# Patient Record
Sex: Female | Born: 1975 | Race: Black or African American | Hispanic: No | Marital: Single | Smoking: Never smoker
Health system: Southern US, Community
[De-identification: ages and names within clinical notes are randomized; demographics above are authoritative.]

## PROBLEM LIST (undated history)

## (undated) DIAGNOSIS — G43909 Migraine, unspecified, not intractable, without status migrainosus: Secondary | ICD-10-CM

## (undated) DIAGNOSIS — Z8742 Personal history of other diseases of the female genital tract: Secondary | ICD-10-CM

## (undated) DIAGNOSIS — O039 Complete or unspecified spontaneous abortion without complication: Secondary | ICD-10-CM

## (undated) DIAGNOSIS — R011 Cardiac murmur, unspecified: Secondary | ICD-10-CM

## (undated) DIAGNOSIS — I10 Essential (primary) hypertension: Secondary | ICD-10-CM

## (undated) HISTORY — PX: BREAST CYST ASPIRATION: SHX578

## (undated) HISTORY — PX: OTHER SURGICAL HISTORY: SHX169

## (undated) HISTORY — DX: Cardiac murmur, unspecified: R01.1

## (undated) HISTORY — DX: Complete or unspecified spontaneous abortion without complication: O03.9

## (undated) HISTORY — DX: Essential (primary) hypertension: I10

## (undated) HISTORY — DX: Migraine, unspecified, not intractable, without status migrainosus: G43.909

## (undated) HISTORY — DX: Personal history of other diseases of the female genital tract: Z87.42

---
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
## 2006-09-04 HISTORY — PX: BREAST LUMPECTOMY: SHX2

## 2008-09-04 DIAGNOSIS — O039 Complete or unspecified spontaneous abortion without complication: Secondary | ICD-10-CM

## 2008-09-04 HISTORY — DX: Complete or unspecified spontaneous abortion without complication: O03.9

## 2008-09-04 HISTORY — PX: OTHER SURGICAL HISTORY: SHX169

## 2017-08-02 ENCOUNTER — Ambulatory Visit (INDEPENDENT_AMBULATORY_CARE_PROVIDER_SITE_OTHER): Payer: BLUE CROSS/BLUE SHIELD | Admitting: Family Medicine

## 2017-08-02 ENCOUNTER — Encounter: Payer: Self-pay | Admitting: Family Medicine

## 2017-08-02 ENCOUNTER — Encounter: Payer: Self-pay | Admitting: *Deleted

## 2017-08-02 VITALS — BP 138/102 | HR 81 | Temp 97.7°F | Ht 66.0 in | Wt 232.8 lb

## 2017-08-02 DIAGNOSIS — R1907 Generalized intra-abdominal and pelvic swelling, mass and lump: Secondary | ICD-10-CM

## 2017-08-02 DIAGNOSIS — R739 Hyperglycemia, unspecified: Secondary | ICD-10-CM

## 2017-08-02 DIAGNOSIS — E7849 Other hyperlipidemia: Secondary | ICD-10-CM

## 2017-08-02 DIAGNOSIS — R112 Nausea with vomiting, unspecified: Secondary | ICD-10-CM | POA: Diagnosis not present

## 2017-08-02 DIAGNOSIS — R1011 Right upper quadrant pain: Secondary | ICD-10-CM

## 2017-08-02 DIAGNOSIS — I10 Essential (primary) hypertension: Secondary | ICD-10-CM

## 2017-08-02 LAB — POCT URINE PREGNANCY: PREG TEST UR: NEGATIVE

## 2017-08-02 MED ORDER — NIFEDIPINE ER 30 MG PO TB24: 30.0000 mg | ORAL_TABLET | Freq: Every day | ORAL | 3 refills | Status: DC

## 2017-08-02 NOTE — Patient Instructions (Addendum)
BEFORE YOU LEAVE: -labs -vaccines if she wishes -follow up: 1 month (30 minutes)  Start the nifedepine and take every day for the blood pressure.  -We placed a referral for you as discussed for a CT scan of the abdomen. Call if this is not scheduled in the next 1 week.  We have ordered labs or studies at this visit. It can take up to 1-2 weeks for results and processing. IF results require follow up or explanation, we will call you with instructions. Clinically stable results will be released to your [facility]. If you have not heard from [place] or cannot find your results in [facility] in 2 weeks please contact our office at [phone].  If you are not yet signed up for [facility], please consider signing up.   We recommend the following healthy lifestyle for LIFE: 1) Small portions. But, make sure to get regular (at least 3 per day), healthy meals and small healthy snacks if needed.  2) Eat a healthy clean diet.   TRY TO EAT: -at least 5-7 servings of low sugar, colorful, and nutrient rich vegetables per day (not corn, potatoes or bananas.) -berries are the best choice if you wish to eat fruit (only eat small amounts if trying to reduce weight)  -lean meets (fish, white meat of chicken or [place]) -vegan proteins for some meals - beans or tofu, whole grains, nuts and seeds -Replace bad fats with good fats - good fats include: fish, nuts and seeds, canola oil, olive oil -small amounts of low fat or non fat dairy -small amounts of100 % whole grains - check the lables -drink plenty of water  AVOID: -SUGAR, sweets, anything with added sugar, corn syrup or sweeteners - must read labels as even foods advertised as "healthy" often are loaded with sugar -if you must have a sweetener, small amounts of stevia may be best -sweetened beverages and artificially sweetened beverages -simple starches (rice, bread, potatoes, pasta, chips, etc - small amounts of 100% whole grains are ok) -red meat, pork,  butter -fried foods, fast food, processed food, excessive dairy, eggs and coconut.  3)Get at least 150 minutes of sweaty aerobic exercise per week.  4)Reduce stress - consider counseling, meditation and relaxation to balance other aspects of your life.

## 2017-08-02 NOTE — Progress Notes (Signed)
HPI:  Crystal Nash is here to establish care.  Last PCP and physical:  Has the following chronic problems that require follow up and concerns today:  Hypertension: -On medications in the past, for medications at [facility] -Reports lost her health insurance and has not been on medications in several years -Went to urgent care for elevated blood pressure recently and they gave her lisinopril, but she has not started -Denies chest pain, shortness of breath, headache, vision changes currently  -She would like to conceive  History of migraines: -These are rare, maybe 1 migraine every 3 months -They respond to Excedrin -No change in frequency or character recently  Reported history of hyperglycemia and hyperlipidemia: -Not on any medications for this  Morbid obesity: -Reports fianc is a wonderful cook and she has been eating too much -No regular exercise  Right upper quadrant pain: -For several years -Intermittent, severe and crampy, usually occurs after meals -Thinks had a workup somewhere in the past, cannot remember where or what they did, reports they did refer her to a specialist -He sometimes has vomiting with these episodes -The episodes occur about once a month -He recently has noticed a mass in her right upper quadrant  ROS negative for unless reported above: fevers, unintentional weight loss, hearing or vision loss, chest pain, palpitations, struggling to breath, hemoptysis, melena, hematochezia, hematuria, falls, loc, si, thoughts of self harm  Past Medical History:  Diagnosis Date  . Heart murmur   . History of abnormal cervical Pap smear 1993  . Hypertension   . Migraines   . Miscarriage 2010      Family History  Problem Relation Age of Onset  . Hypertension Mother   . Hyperlipidemia Mother   . Hypertension Father   . Hyperlipidemia Father   . Heart disease Father     Social History   Socioeconomic History  . Marital status: Single    Spouse  name: None  . Number of children: None  . Years of education: None  . Highest education level: None  Social Needs  . Financial resource strain: None  . Food insecurity - worry: None  . Food insecurity - inability: None  . Transportation needs - medical: None  . Transportation needs - non-medical: None  Occupational History  . None  Tobacco Use  . Smoking status: Never Smoker  . Smokeless tobacco: Never Used  Substance and Sexual Activity  . Alcohol use: None  . Drug use: None  . Sexual activity: None  Other Topics Concern  . None  Social History Narrative  . None     Current Outpatient Medications:  .  lisinopril-hydrochlorothiazide (PRINZIDE,ZESTORETIC) 10-12.5 MG tablet, Take 1 tablet by mouth daily., Disp: , Rfl:  .  NIFEdipine (PROCARDIA-XL/ADALAT CC) 30 MG 24 hr tablet, Take 1 tablet (30 mg total) by mouth daily., Disp: 30 tablet, Rfl: 3  EXAM:  Vitals:   08/02/17 1450  BP: (!) 138/102  Pulse: 81  Temp: 97.7 F (36.5 C)    Body mass index is 37.57 kg/m.  GENERAL: vitals reviewed and listed above, alert, oriented, appears well hydrated and in no acute distress  HEENT: atraumatic, conjunttiva clear, no obvious abnormalities on inspection of external nose and ears  NECK: no obvious masses on inspection  LUNGS: clear to auscultation bilaterally, no wheezes, rales or rhonchi, good air movement  CV: HRRR, no peripheral edema  Abdomen: Bowel sounds positive all 4 quadrants, small firm mass about the size of a golf ball in  the right upper quadrant in the area of the gallbladder, Some tenderness to palpation here, otherwise no tenderness to palpation, rebound or guarding  MS: moves all extremities without noticeable abnormality  PSYCH: pleasant and cooperative, no obvious depression or anxiety  ASSESSMENT AND PLAN:  Discussed the following assessment and plan: More than 50% of over 45 minutes spent in total in caring for this patient was spent face-to-face  with the patient, counseling and/or coordinating care.    Essential hypertension - Plan: Comprehensive metabolic panel, CBC -Discussed options for treatment, she wants something safe in pregnancy, but she thinks calcium channel blockers worked well for her -We will start nifedipine -Advised her to get plugged in with an obstetrician given her age and desire to conceive along with other health issues -Follow-up in 1 month  Morbid obesity ([place]) - Plan: Hemoglobin A1c, HDL cholesterol, Cholesterol, total Other hyperlipidemia Hyperglycemia -Lifestyle recommendations -Labs, further treatment pending results  RUQ pain Generalized intra-abdominal and pelvic swelling, mass and lump - Plan: CT Abdomen Pelvis W Contrast Nausea and vomiting, intractability of vomiting not specified, unspecified vomiting type - Plan: CT Abdomen Pelvis W Contrast, POCT urine pregnancy -Urine pregnancy negative -CT abdomen, labs  -We reviewed the PMH, PSH, FH, SH, Meds and Allergies. -We provided refills for any medications we will prescribe as needed. -We addressed current concerns per orders and patient instructions. -We have asked for records for pertinent exams, studies, vaccines and notes from previous providers. -We have advised patient to follow up per instructions below.   -Patient advised to return or notify a doctor immediately if symptoms worsen or persist or new concerns arise.  Patient Instructions  BEFORE YOU LEAVE: -labs -vaccines if she wishes -follow up: 1 month (30 minutes)  Start the nifedepine and take every day for the blood pressure.  -We placed a referral for you as discussed for a CT scan of the abdomen. Call if this is not scheduled in the next 1 week.  We have ordered labs or studies at this visit. It can take up to 1-2 weeks for results and processing. IF results require follow up or explanation, we will call you with instructions. Clinically stable results will be released to  your [facility]. If you have not heard from [place] or cannot find your results in [facility] in 2 weeks please contact our office at [phone].  If you are not yet signed up for [facility], please consider signing up.   We recommend the following healthy lifestyle for LIFE: 1) Small portions. But, make sure to get regular (at least 3 per day), healthy meals and small healthy snacks if needed.  2) Eat a healthy clean diet.   TRY TO EAT: -at least 5-7 servings of low sugar, colorful, and nutrient rich vegetables per day (not corn, potatoes or bananas.) -berries are the best choice if you wish to eat fruit (only eat small amounts if trying to reduce weight)  -lean meets (fish, white meat of chicken or [place]) -vegan proteins for some meals - beans or tofu, whole grains, nuts and seeds -Replace bad fats with good fats - good fats include: fish, nuts and seeds, canola oil, olive oil -small amounts of low fat or non fat dairy -small amounts of100 % whole grains - check the lables -drink plenty of water  AVOID: -SUGAR, sweets, anything with added sugar, corn syrup or sweeteners - must read labels as even foods advertised as "healthy" often are loaded with sugar -if you must have a sweetener,  small amounts of stevia may be best -sweetened beverages and artificially sweetened beverages -simple starches (rice, bread, potatoes, pasta, chips, etc - small amounts of 100% whole grains are ok) -red meat, pork, butter -fried foods, fast food, processed food, excessive dairy, eggs and coconut.  3)Get at least 150 minutes of sweaty aerobic exercise per week.  4)Reduce stress - consider counseling, meditation and relaxation to balance other aspects of your life.            Colin Benton R.

## 2017-08-03 LAB — CBC
HCT: 37.2 % (ref 36.0–46.0)
Hemoglobin: 11.8 g/dL — ABNORMAL LOW (ref 12.0–15.0)
MCHC: 31.7 g/dL (ref 30.0–36.0)
MCV: 84.4 fl (ref 78.0–100.0)
PLATELETS: 443 10*3/uL — AB (ref 150.0–400.0)
RBC: 4.41 Mil/uL (ref 3.87–5.11)
RDW: 16.3 % — AB (ref 11.5–15.5)
WBC: 8.9 10*3/uL (ref 4.0–10.5)

## 2017-08-03 LAB — COMPREHENSIVE METABOLIC PANEL
ALT: 28 U/L (ref 0–35)
AST: 24 U/L (ref 0–37)
Albumin: 4.3 g/dL (ref 3.5–5.2)
Alkaline Phosphatase: 105 U/L (ref 39–117)
BILIRUBIN TOTAL: 0.4 mg/dL (ref 0.2–1.2)
BUN: 13 mg/dL (ref 6–23)
CO2: 29 meq/L (ref 19–32)
CREATININE: 0.95 mg/dL (ref 0.40–1.20)
Calcium: 9.9 mg/dL (ref 8.4–10.5)
Chloride: 100 mEq/L (ref 96–112)
GFR: 82.75 mL/min (ref >60.00)
GLUCOSE: 94 mg/dL (ref 70–99)
Potassium: 3.5 mEq/L (ref 3.5–5.1)
Sodium: 138 mEq/L (ref 135–145)
TOTAL PROTEIN: 8.5 g/dL — AB (ref 6.0–8.3)

## 2017-08-03 LAB — HEMOGLOBIN A1C: HEMOGLOBIN A1C: 6.2 % (ref 4.6–6.5)

## 2017-08-03 LAB — HDL CHOLESTEROL: HDL: 50.1 mg/dL (ref >39.00)

## 2017-08-03 LAB — CHOLESTEROL, TOTAL: Cholesterol: 213 mg/dL — ABNORMAL HIGH (ref 0–200)

## 2017-08-13 ENCOUNTER — Inpatient Hospital Stay: Admission: RE | Admit: 2017-08-13 | Payer: BLUE CROSS/BLUE SHIELD | Source: Ambulatory Visit

## 2017-08-14 ENCOUNTER — Inpatient Hospital Stay: Admission: RE | Admit: 2017-08-14 | Payer: BLUE CROSS/BLUE SHIELD | Source: Ambulatory Visit

## 2017-08-16 ENCOUNTER — Encounter: Payer: Self-pay | Admitting: *Deleted

## 2017-08-16 ENCOUNTER — Ambulatory Visit (INDEPENDENT_AMBULATORY_CARE_PROVIDER_SITE_OTHER)
Admission: RE | Admit: 2017-08-16 | Discharge: 2017-08-16 | Disposition: A | Discharge status: Home / Self Care | Payer: BLUE CROSS/BLUE SHIELD | Source: Ambulatory Visit | Attending: Family Medicine | Admitting: Family Medicine

## 2017-08-16 DIAGNOSIS — R1907 Generalized intra-abdominal and pelvic swelling, mass and lump: Secondary | ICD-10-CM | POA: Diagnosis not present

## 2017-08-16 DIAGNOSIS — R112 Nausea with vomiting, unspecified: Secondary | ICD-10-CM

## 2017-08-16 MED ORDER — IOPAMIDOL (ISOVUE-300) INJECTION 61%
100.0000 mL | Freq: Once | INTRAVENOUS | Status: AC | PRN
  Administered 2017-08-16: 100 mL via INTRAVENOUS

## 2017-09-09 NOTE — Progress Notes (Deleted)
HPI:  Follow up:  HTN: -poor compliance with care due to insurance issues prior to establishing here 07/2017 -she wants to conceive so started nifedepine last visit and advised preconception visit with obstetrician given comorbidities and age -reports: -denies:  Obesity, Prediabetes, Hyperlipidemia, elevated platelets. -advised stool cards and period history, but my staff was not be able to reach her by phone about her labs, they did send a letter per review notes -advised lifestyle recs last visit  RUQ pain: -chronic, many years -intermittent, crampy, after meals, sometimes emesis -she also thought there was a mass -CMP was ok, CT with "minimal ventral hernis" per report, but otherwise normal -pt reports: -denies:  ROS: See pertinent positives and negatives per HPI.  Past Medical History:  Diagnosis Date  . Heart murmur   . History of abnormal cervical Pap smear 1993  . Hypertension   . Migraines   . Miscarriage 2010    *** The histories are not reviewed yet. Please review them in the "History" navigator section and refresh this [place].  Family History  Problem Relation Age of Onset  . Hypertension Mother   . Hyperlipidemia Mother   . Hypertension Father   . Hyperlipidemia Father   . Heart disease Father     Social History   Socioeconomic History  . Marital status: Single    Spouse name: Not on file  . Number of children: Not on file  . Years of education: Not on file  . Highest education level: Not on file  Social Needs  . Financial resource strain: Not on file  . Food insecurity - worry: Not on file  . Food insecurity - inability: Not on file  . Transportation needs - medical: Not on file  . Transportation needs - non-medical: Not on file  Occupational History  . Not on file  Tobacco Use  . Smoking status: Never Smoker  . Smokeless tobacco: Never Used  Substance and Sexual Activity  . Alcohol use: Not on file  . Drug use: Not on file  . Sexual  activity: Not on file  Other Topics Concern  . Not on file  Social History Narrative  . Not on file     Current Outpatient Medications:  .  lisinopril-hydrochlorothiazide (PRINZIDE,ZESTORETIC) 10-12.5 MG tablet, Take 1 tablet by mouth daily., Disp: , Rfl:  .  NIFEdipine (PROCARDIA-XL/ADALAT CC) 30 MG 24 hr tablet, Take 1 tablet (30 mg total) by mouth daily., Disp: 30 tablet, Rfl: 3  EXAM:  There were no vitals filed for this visit.  There is no height or weight on file to calculate BMI.  GENERAL: vitals reviewed and listed above, alert, oriented, appears well hydrated and in no acute distress  HEENT: atraumatic, conjunttiva clear, no obvious abnormalities on inspection of external nose and ears  NECK: no obvious masses on inspection  LUNGS: clear to auscultation bilaterally, no wheezes, rales or rhonchi, good air movement  CV: HRRR, no peripheral edema  MS: moves all extremities without noticeable abnormality *** PSYCH: pleasant and cooperative, no obvious depression or anxiety  ASSESSMENT AND PLAN:  Discussed the following assessment and plan:  No diagnosis found.  -advised again establishing with ob for preconception care, pap, etc -advise GI consult for ongoing abd pain, nausea, vomiting and mild anemia - will obtain h. Pylori/celiac stool/lab test in interim -lifestyle recs for cholesterol, morbid obesity, prediabetes, hypertension -advise 20 lb wt reduction in next 3-6 months  -follow up 3-4 months -Patient advised to return or notify a  doctor immediately if symptoms worsen or persist or new concerns arise.  There are no Patient Instructions on file for this visit.  Colin Benton R., DO

## 2017-09-10 ENCOUNTER — Ambulatory Visit: Payer: BLUE CROSS/BLUE SHIELD | Admitting: Family Medicine

## 2017-09-10 NOTE — Progress Notes (Deleted)
  HPI:  ***  Follow up:  HTN: -started nifedepine last visit as desires to conceive  Hyperglycemia, hyperlipidemia, obesity: -lifestyle recs advised  RUQ pain: -intermittent, chronic for several years -also mild anemia, o/w labs ok -sometimes vomiting with episodes -she thought she perceived a mass in the RUQ - CT scan ok other then minimal ventral hernia  ROS: See pertinent positives and negatives per HPI.  Past Medical History:  Diagnosis Date  . Heart murmur   . History of abnormal cervical Pap smear 1993  . Hypertension   . Migraines   . Miscarriage 2010    *** The histories are not reviewed yet. Please review them in the "History" navigator section and refresh this [place].  Family History  Problem Relation Age of Onset  . Hypertension Mother   . Hyperlipidemia Mother   . Hypertension Father   . Hyperlipidemia Father   . Heart disease Father     Social History   Socioeconomic History  . Marital status: Single    Spouse name: Not on file  . Number of children: Not on file  . Years of education: Not on file  . Highest education level: Not on file  Social Needs  . Financial resource strain: Not on file  . Food insecurity - worry: Not on file  . Food insecurity - inability: Not on file  . Transportation needs - medical: Not on file  . Transportation needs - non-medical: Not on file  Occupational History  . Not on file  Tobacco Use  . Smoking status: Never Smoker  . Smokeless tobacco: Never Used  Substance and Sexual Activity  . Alcohol use: Not on file  . Drug use: Not on file  . Sexual activity: Not on file  Other Topics Concern  . Not on file  Social History Narrative  . Not on file     Current Outpatient Medications:  .  lisinopril-hydrochlorothiazide (PRINZIDE,ZESTORETIC) 10-12.5 MG tablet, Take 1 tablet by mouth daily., Disp: , Rfl:  .  NIFEdipine (PROCARDIA-XL/ADALAT CC) 30 MG 24 hr tablet, Take 1 tablet (30 mg total) by mouth daily.,  Disp: 30 tablet, Rfl: 3  EXAM:  There were no vitals filed for this visit.  There is no height or weight on file to calculate BMI.  GENERAL: vitals reviewed and listed above, alert, oriented, appears well hydrated and in no acute distress  HEENT: atraumatic, conjunttiva clear, no obvious abnormalities on inspection of external nose and ears  NECK: no obvious masses on inspection  LUNGS: clear to auscultation bilaterally, no wheezes, rales or rhonchi, good air movement  CV: HRRR, no peripheral edema  MS: moves all extremities without noticeable abnormality *** PSYCH: pleasant and cooperative, no obvious depression or anxiety  ASSESSMENT AND PLAN:  Discussed the following assessment and plan:  No diagnosis found.  -gyn for preconception visit advised -lifestyle recs and wt reduction advised -GI referral regarding N/V/abd pain, celiac and H. Pylori test in interim - may need hida scan as well, trial nexium in interim -Patient advised to return or notify a doctor immediately if symptoms worsen or persist or new concerns arise.  There are no Patient Instructions on file for this visit.  Colin Benton R., DO

## 2017-09-13 ENCOUNTER — Ambulatory Visit: Payer: BLUE CROSS/BLUE SHIELD | Admitting: Family Medicine

## 2017-11-05 NOTE — Progress Notes (Signed)
HPI:  Crystal Nash 1st seen last visit 4 months ago. 1 month follow up advised. My staff has called and sent mail regarding her results, but she did not return the calls/messages - so I am glad she is here today. Due for lab, anemia panel, stool cards, GI referral and GYN physical  HTN: -started nifedepine at last visit as she reported trying to conceive - advise she also get plugged in with OB and f/u in 1 month  -She did not start the medication, she reports she knows that she has been bad -Reports she feels fine without any chest pain, shortness of breath, headache, dizziness or vision changes -She has not contacted the obstetrician   Morbid obesity/Hyperlipidemia/Hiperglycemia: -advised lifestyle changes -no significant changes  RUQ pain: -advised CT, negative -advise GI evaluation - but my staff could not reach patient -Reports she still has some discomfort in the right upper quadrant usually after drinking alcohol or eating a fatty meal, she still has nausea and vomiting following these incidents several times per month -Denies fevers, unexplained weight loss, blood in the bowels  Anemia: -pt was contact about labs numerous times by phone and mail per chart, but did not return call -Admits to heavy menstrual bleeding, uses overnight pads for the first several days and changes frequently -Bleeding typically last about 6-7 days and occurs monthly -First day of her last menstrual.  Was November 03, 2017, currently still bleeding -Denies any other history of abnormal bleeding or history of anemia  ROS: See pertinent positives and negatives per HPI.  Past Medical History:  Diagnosis Date  . Heart murmur   . History of abnormal cervical Pap smear 1993  . Hypertension   . Migraines   . Miscarriage 2010    Past Surgical History:  Procedure Laterality Date  . abdominal cyst  2010   no surgery performed-diagnosed after imaging test  . Abdominal cyst  2010   diagnosed  after imaging test  . BREAST LUMPECTOMY Right 2008   benign per patient  . OTHER SURGICAL HISTORY     Elective abortion    Family History  Problem Relation Age of Onset  . Hypertension Mother   . Hyperlipidemia Mother   . Hypertension Father   . Hyperlipidemia Father   . Heart disease Father     Social History   Socioeconomic History  . Marital status: Single    Spouse name: None  . Number of children: None  . Years of education: None  . Highest education level: None  Social Needs  . Financial resource strain: None  . Food insecurity - worry: None  . Food insecurity - inability: None  . Transportation needs - medical: None  . Transportation needs - non-medical: None  Occupational History  . None  Tobacco Use  . Smoking status: Never Smoker  . Smokeless tobacco: Never Used  Substance and Sexual Activity  . Alcohol use: None  . Drug use: None  . Sexual activity: None  Other Topics Concern  . None  Social History Narrative  . None     Current Outpatient Medications:  .  NIFEdipine (PROCARDIA-XL/ADALAT CC) 30 MG 24 hr tablet, Take 1 tablet (30 mg total) by mouth daily., Disp: 30 tablet, Rfl: 3  EXAM:  Vitals:   11/06/17 1104 11/06/17 1107  BP: (!) 170/110 (!) 160/100  Pulse: 63   Temp: 98.2 F (36.8 C)     Body mass index is 37.95 kg/m.  GENERAL: vitals reviewed and  listed above, alert, oriented, appears well hydrated and in no acute distress  HEENT: atraumatic, conjunttiva clear, no obvious abnormalities on inspection of external nose and ears  NECK: no obvious masses on inspection  LUNGS: clear to auscultation bilaterally, no wheezes, rales or rhonchi, good air movement  CV: HRRR, no peripheral edema  ABD: soft, nttp  MS: moves all extremities without noticeable abnormality  PSYCH: pleasant and cooperative, no obvious depression or anxiety  ASSESSMENT AND PLAN:  Discussed the following assessment and plan:  Essential  hypertension -Discussed significant risks of persistently elevated hypertension including severe complications such as stroke, cardiovascular disease and kidney disease -Advised that she start her medication right away, she still plans to try to conceive, will stick to the nifedipine we sent before -Advised that she pick up the prescription today and contact us immediately if any difficulty getting the medication -Advised prompt follow-up in 1 month -Lifestyle recommendations as well  Anemia, unspecified type - Plan: CBC with Differential/Platelet, Anemia Panel, Ambulatory referral to Gastroenterology -Discussed lab findings and implications -Advised repeat labs in stool cards when she is off of her period for 1 week -Also will send referral to GI given her nausea, vomiting, abdominal discomfort and anemia  RUQ pain Nausea and vomiting, intractability of vomiting not specified, unspecified vomiting type - Plan: Ambulatory referral to Gastroenterology  Other hyperlipidemia - Plan: Lipid panel -Fasting cholesterol panel today -Lifestyle recommendations  Morbid obesity ([place]) -Lifestyle recommendations  -She is due for a Pap smear.  She plans to see gynecology for preconception and advised that she call  today to schedule an appointment.  Advised no alcohol given her stomach issues and thatshe is trying to conceive and a healthy diet.  Patient Instructions  BEFORE YOU LEAVE: -Stool cards for anemia -Labs -follow up: 1 month  Please ensure that you have a phone number in our system where we can contact you.  Start her blood pressure medication, nifedipine, today.  We sent this to the pharmacy at your last visit.  If you have any trouble getting this medication contact [facility] immediately.  Please complete the stool cards 1 week after you finish your menstrual period.  Call to set up a gynecology visit today.    -We placed a referral for you as discussed. It usually takes about 1-2  weeks to process and schedule this referral. If you have not heard from [place] regarding this appointment in 2 weeks please contact our office.   We recommend the following healthy lifestyle for LIFE: 1) Small portions. But, make sure to get regular (at least 3 per day), healthy meals and small healthy snacks if needed.  2) Eat a healthy clean diet.   TRY TO EAT: -at least 5-7 servings of low sugar, colorful, and nutrient rich vegetables per day (not corn, potatoes or bananas.) -berries are the best choice if you wish to eat fruit (only eat small amounts if trying to reduce weight)  -lean meets (fish, white meat of chicken or [place]) -vegan proteins for some meals - beans or tofu, whole grains, nuts and seeds -Replace bad fats with good fats - good fats include: fish, nuts and seeds, canola oil, olive oil -small amounts of low fat or non fat dairy -small amounts of100 % whole grains - check the lables -drink plenty of water  AVOID: -SUGAR, sweets, anything with added sugar, corn syrup or sweeteners - must read labels as even foods advertised as "healthy" often are loaded with sugar -if you must  have a sweetener, small amounts of stevia may be best -sweetened beverages and artificially sweetened beverages -simple starches (rice, bread, potatoes, pasta, chips, etc - small amounts of 100% whole grains are ok) -red meat, pork, butter -fried foods, fast food, processed food, excessive dairy, eggs and coconut.  3)Get at least 150 minutes of sweaty aerobic exercise per week.  4)Reduce stress - consider counseling, meditation and relaxation to balance other aspects of your life.     Lucretia Kern, DO

## 2017-11-06 ENCOUNTER — Encounter: Payer: Self-pay | Admitting: Family Medicine

## 2017-11-06 ENCOUNTER — Ambulatory Visit: Payer: 59 | Admitting: Family Medicine

## 2017-11-06 VITALS — BP 160/100 | HR 63 | Temp 98.2°F | Ht 66.0 in | Wt 235.1 lb

## 2017-11-06 DIAGNOSIS — R112 Nausea with vomiting, unspecified: Secondary | ICD-10-CM | POA: Diagnosis not present

## 2017-11-06 DIAGNOSIS — R1011 Right upper quadrant pain: Secondary | ICD-10-CM | POA: Diagnosis not present

## 2017-11-06 DIAGNOSIS — I10 Essential (primary) hypertension: Secondary | ICD-10-CM

## 2017-11-06 DIAGNOSIS — E7849 Other hyperlipidemia: Secondary | ICD-10-CM

## 2017-11-06 DIAGNOSIS — Z113 Encounter for screening for infections with a predominantly sexual mode of transmission: Secondary | ICD-10-CM

## 2017-11-06 DIAGNOSIS — D649 Anemia, unspecified: Secondary | ICD-10-CM | POA: Diagnosis not present

## 2017-11-06 LAB — CBC WITH DIFFERENTIAL/PLATELET
BASOS ABS: 0.1 10*3/uL (ref 0.0–0.1)
Basophils Relative: 1 % (ref 0.0–3.0)
EOS ABS: 0.2 10*3/uL (ref 0.0–0.7)
Eosinophils Relative: 3.7 % (ref 0.0–5.0)
HEMATOCRIT: 35.1 % — AB (ref 36.0–46.0)
Hemoglobin: 11.8 g/dL — ABNORMAL LOW (ref 12.0–15.0)
LYMPHS PCT: 25.5 % (ref 12.0–46.0)
Lymphs Abs: 1.7 10*3/uL (ref 0.7–4.0)
MCHC: 33.6 g/dL (ref 30.0–36.0)
MCV: 81.3 fl (ref 78.0–100.0)
Monocytes Absolute: 0.3 10*3/uL (ref 0.1–1.0)
Monocytes Relative: 4.8 % (ref 3.0–12.0)
NEUTROS ABS: 4.3 10*3/uL (ref 1.4–7.7)
Neutrophils Relative %: 65 % (ref 43.0–77.0)
PLATELETS: 430 10*3/uL — AB (ref 150.0–400.0)
RBC: 4.31 Mil/uL (ref 3.87–5.11)
RDW: 16.5 % — ABNORMAL HIGH (ref 11.5–15.5)
WBC: 6.6 10*3/uL (ref 4.0–10.5)

## 2017-11-06 LAB — LIPID PANEL
CHOL/HDL RATIO: 4
CHOLESTEROL: 179 mg/dL (ref 0–200)
HDL: 49.1 mg/dL (ref >39.00)
LDL Cholesterol: 103 mg/dL — ABNORMAL HIGH (ref 0–99)
NonHDL: 130.25
TRIGLYCERIDES: 138 mg/dL (ref 0.0–149.0)
VLDL: 27.6 mg/dL (ref 0.0–40.0)

## 2017-11-06 NOTE — Addendum Note (Signed)
Addended by: Agnes Lawrence on: 11/06/2017 11:51 AM   Modules accepted: Orders

## 2017-11-06 NOTE — Patient Instructions (Signed)
BEFORE YOU LEAVE: -Stool cards for anemia -Labs -follow up: 1 month  Please ensure that you have a phone number in our system where we can contact you.  Start her blood pressure medication, nifedipine, today.  We sent this to the pharmacy at your last visit.  If you have any trouble getting this medication contact [facility] immediately.  Please complete the stool cards 1 week after you finish your menstrual period.  Call to set up a gynecology visit today.    -We placed a referral for you as discussed. It usually takes about 1-2 weeks to process and schedule this referral. If you have not heard from [place] regarding this appointment in 2 weeks please contact our office.   We recommend the following healthy lifestyle for LIFE: 1) Small portions. But, make sure to get regular (at least 3 per day), healthy meals and small healthy snacks if needed.  2) Eat a healthy clean diet.   TRY TO EAT: -at least 5-7 servings of low sugar, colorful, and nutrient rich vegetables per day (not corn, potatoes or bananas.) -berries are the best choice if you wish to eat fruit (only eat small amounts if trying to reduce weight)  -lean meets (fish, white meat of chicken or [place]) -vegan proteins for some meals - beans or tofu, whole grains, nuts and seeds -Replace bad fats with good fats - good fats include: fish, nuts and seeds, canola oil, olive oil -small amounts of low fat or non fat dairy -small amounts of100 % whole grains - check the lables -drink plenty of water  AVOID: -SUGAR, sweets, anything with added sugar, corn syrup or sweeteners - must read labels as even foods advertised as "healthy" often are loaded with sugar -if you must have a sweetener, small amounts of stevia may be best -sweetened beverages and artificially sweetened beverages -simple starches (rice, bread, potatoes, pasta, chips, etc - small amounts of 100% whole grains are ok) -red meat, pork, butter -fried foods, fast food,  processed food, excessive dairy, eggs and coconut.  3)Get at least 150 minutes of sweaty aerobic exercise per week.  4)Reduce stress - consider counseling, meditation and relaxation to balance other aspects of your life.

## 2017-11-06 NOTE — Addendum Note (Signed)
Addended by: Agnes Lawrence on: 11/06/2017 11:44 AM   Modules accepted: Orders

## 2017-11-07 LAB — HIV ANTIBODY (ROUTINE TESTING W REFLEX): HIV: NONREACTIVE

## 2017-11-09 ENCOUNTER — Telehealth: Payer: Self-pay | Admitting: *Deleted

## 2017-11-09 NOTE — Telephone Encounter (Signed)
See results note. Crystal Nash     Copied from [place] [id number]. Topic: General - Other >> Nov 06, 2017 12:12 PM Cecelia Byars, NT wrote: Reason for CRM: Patient returned call from the practice says she will be waiting for return call at the same number ,she said her voicemail is full so no message can be left ,thanks

## 2017-12-06 ENCOUNTER — Ambulatory Visit: Payer: Self-pay | Admitting: Obstetrics & Gynecology

## 2017-12-07 ENCOUNTER — Encounter: Payer: Self-pay | Admitting: Gastroenterology

## 2017-12-15 NOTE — Progress Notes (Deleted)
  HPI:  Using dictation device. Unfortunately this device frequently misinterprets words/phrases.  Follow up HTN: -relatively new to our practice, with poor compliance with prior recs - see note 11/2017 -today reports: -denies: -gi referral? Gyn for pap? Alcohol? Nifedepine?  ROS: See pertinent positives and negatives per HPI.  Past Medical History:  Diagnosis Date  . Heart murmur   . History of abnormal cervical Pap smear 1993  . Hypertension   . Migraines   . Miscarriage 2010    Past Surgical History:  Procedure Laterality Date  . abdominal cyst  2010   no surgery performed-diagnosed after imaging test  . Abdominal cyst  2010   diagnosed after imaging test  . BREAST LUMPECTOMY Right 2008   benign per patient  . OTHER SURGICAL HISTORY     Elective abortion    Family History  Problem Relation Age of Onset  . Hypertension Mother   . Hyperlipidemia Mother   . Hypertension Father   . Hyperlipidemia Father   . Heart disease Father     SOCIAL HX: ***   Current Outpatient Medications:  .  NIFEdipine (PROCARDIA-XL/ADALAT CC) 30 MG 24 hr tablet, Take 1 tablet (30 mg total) by mouth daily., Disp: 30 tablet, Rfl: 3  EXAM:  There were no vitals filed for this visit.  There is no height or weight on file to calculate BMI.  GENERAL: vitals reviewed and listed above, alert, oriented, appears well hydrated and in no acute distress  HEENT: atraumatic, conjunttiva clear, no obvious abnormalities on inspection of external nose and ears  NECK: no obvious masses on inspection  LUNGS: clear to auscultation bilaterally, no wheezes, rales or rhonchi, good air movement  CV: HRRR, no peripheral edema  MS: moves all extremities without noticeable abnormality *** PSYCH: pleasant and cooperative, no obvious depression or anxiety  ASSESSMENT AND PLAN:  Discussed the following assessment and plan:  No diagnosis found.  *** -Patient advised to return or notify a doctor  immediately if symptoms worsen or persist or new concerns arise.  There are no Patient Instructions on file for this visit.  Lucretia Kern, DO

## 2017-12-17 ENCOUNTER — Ambulatory Visit: Payer: 59 | Admitting: Family Medicine

## 2017-12-17 DIAGNOSIS — Z2089 Contact with and (suspected) exposure to other communicable diseases: Secondary | ICD-10-CM

## 2018-01-21 ENCOUNTER — Ambulatory Visit: Payer: 59 | Admitting: Gastroenterology

## 2018-01-25 ENCOUNTER — Encounter: Payer: Self-pay | Admitting: Family Medicine

## 2018-08-07 ENCOUNTER — Other Ambulatory Visit: Payer: Self-pay | Admitting: Family Medicine

## 2018-08-07 DIAGNOSIS — Z1231 Encounter for screening mammogram for malignant neoplasm of breast: Secondary | ICD-10-CM

## 2018-09-16 ENCOUNTER — Ambulatory Visit
Admission: RE | Admit: 2018-09-16 | Discharge: 2018-09-16 | Disposition: A | Discharge status: Home / Self Care | Payer: 59 | Source: Ambulatory Visit | Attending: Family Medicine | Admitting: Family Medicine

## 2018-09-16 DIAGNOSIS — Z1231 Encounter for screening mammogram for malignant neoplasm of breast: Secondary | ICD-10-CM

## 2018-09-22 NOTE — Progress Notes (Addendum)
HPI:  Using dictation device. Unfortunately this device frequently misinterprets words/phrases. Correcting typos.  Crystal Nash is a pleasant 43 y.o. here for follow up. Chronic medical problems summarized below were reviewed for changes.  Hx of very poor compliance. We have given her special attention, time and energy to try to help her with her health concerns given her poor compliance, but she has repetitively failed to follow our medical recommendations. Last seen almost a year ago.  Reports "I have not been good."  She did not take her medication for the blood pressure.  Reports it gave her headaches, so she stopped.  Did not make changes to the lifestyle.  Did not complete the stool cards.  Reports she would like to get back on her old blood pressure medications that she took in the past.  Reports she took lisinopril, hydrochlorothiazide and another medication.  Currently not conceiving.  She has an appointment with her gynecologist next month for her Pap smear and GYN exam.  She refuses a flu shot, reports it makes people sick.  She agrees to tetanus shot after discussion of risk and benefits, but not the flu shot.  Denies CP, SOB, DOE, treatment intolerance or new symptoms.   HTN: -started nifedepine in the past as she planned to see ob and wanted to conceive, she did not follow up for recheck -recommended a healthy low sugar diet, regular exercise and wt reduction -she reports did not take the medication as it gave her headaches, not currently conceiving and wants to go back on old medications -reports was on lisinopril, hxtz and another med in the past and did well -denies HA, CP, SOB, DOE, swelling   Hyperlipidemia, Hyperglycemia, Morbid Obesity ( BMI > 35 + HTN, HLD and hyperglycemia): -advised lifestyle changes  -no significant changes -she is considering ideal protein diet and exercise  Hx RUQ pain: -none reported today -advised CT, negative -advised GI evaluation -  she did not return GI office calls  -Denies fevers, unexplained weight loss, blood in the bowels  Anemia: -pt was contacted about labs numerous times by phone and mail per chart, but did not return call -Admitted to heavy menstrual bleeding, uses overnight pads for the first several days and changes frequently -Bleeding typically last about 6-7 days and occurs monthly -Denies any other history of abnormal bleeding or history of anemia -we advised stool cards, GI eval, gyn eval and 1 month follow up at her last visit - she did none of theses -fdlmp last week   ROS: See pertinent positives and negatives per HPI.  Past Medical History:  Diagnosis Date  . Heart murmur   . History of abnormal cervical Pap smear 1993  . Hypertension   . Migraines   . Miscarriage 2010    Past Surgical History:  Procedure Laterality Date  . abdominal cyst  2010   no surgery performed-diagnosed after imaging test  . Abdominal cyst  2010   diagnosed after imaging test  . BREAST CYST ASPIRATION    . BREAST LUMPECTOMY Right 2008   benign per patient  . OTHER SURGICAL HISTORY     Elective abortion    Family History  Problem Relation Age of Onset  . Hypertension Mother   . Hyperlipidemia Mother   . Hypertension Father   . Hyperlipidemia Father   . Heart disease Father     SOCIAL HX: see hpi   Current Outpatient Medications:  .  lisinopril-hydrochlorothiazide (PRINZIDE,ZESTORETIC) 20-25 MG tablet, Take 1 tablet  by mouth daily., Disp: 90 tablet, Rfl: 3  EXAM:  Vitals:   09/23/18 0850 09/23/18 0853  BP: (!) 160/90 (!) 160/90  Pulse: 88   Temp: 98.3 F (36.8 C)     Body mass index is 37.45 kg/m.  GENERAL: vitals reviewed and listed above, alert, oriented, appears well hydrated and in no acute distress  HEENT: atraumatic, conjunttiva clear, no obvious abnormalities on inspection of external nose and ears  NECK: no obvious masses on inspection  LUNGS: clear to auscultation  bilaterally, no wheezes, rales or rhonchi, good air movement  CV: HRRR, no peripheral edema  MS: moves all extremities without noticeable abnormality  PSYCH: pleasant and cooperative, no obvious depression or anxiety  ASSESSMENT AND PLAN:  Discussed the following assessment and plan:  Essential hypertension - Plan: Basic metabolic panel, CBC  Hyperlipidemia, unspecified hyperlipidemia type  Morbid obesity (HCC)  Anemia, unspecified type  -heart to heart regarding implications of untreated blood pressure, obesity, poor compliance, poor health habits. -discussed risks/benefits options for BP treatment and she has agreed to start lisinopril/hctz with close follow up and additional medication may be needed depending on lifestyle changes -she denies barriers to recommended instructions and follow up -labs per orders -refuses flu shot, agrees to tetanus booster after discussion risks/benefits -recs per prior if anemia remains -follow up 1 month -Patient advised to return or notify a doctor immediately if symptoms worsen or persist or new concerns arise.  Patient Instructions  BEFORE YOU LEAVE: -tetanus booster -labs -follow up: 1 month for blood pressure  START the new combo blood pressure medication lisinopril-hctz and take once daily, every day, in the morning.  Eat a healthy low sugar diet and get regular exercise.  We have ordered labs or studies at this visit. It can take up to 1-2 weeks for results and processing. IF results require follow up or explanation, we will call you with instructions. Clinically stable results will be released to your [facility]. If you have not heard from [place] or cannot find your results in [facility] in 2 weeks please contact our office at [phone].  If you are not yet signed up for [facility], please consider signing up.            Lucretia Kern, DO

## 2018-09-23 ENCOUNTER — Encounter: Payer: Self-pay | Admitting: Family Medicine

## 2018-09-23 ENCOUNTER — Ambulatory Visit: Payer: 59 | Admitting: Family Medicine

## 2018-09-23 VITALS — BP 160/90 | HR 88 | Temp 98.3°F | Ht 66.0 in | Wt 232.0 lb

## 2018-09-23 DIAGNOSIS — R7989 Other specified abnormal findings of blood chemistry: Secondary | ICD-10-CM

## 2018-09-23 DIAGNOSIS — E785 Hyperlipidemia, unspecified: Secondary | ICD-10-CM

## 2018-09-23 DIAGNOSIS — Z23 Encounter for immunization: Secondary | ICD-10-CM | POA: Diagnosis not present

## 2018-09-23 DIAGNOSIS — I10 Essential (primary) hypertension: Secondary | ICD-10-CM | POA: Diagnosis not present

## 2018-09-23 DIAGNOSIS — D649 Anemia, unspecified: Secondary | ICD-10-CM | POA: Diagnosis not present

## 2018-09-23 LAB — CBC
HEMATOCRIT: 37 % (ref 36.0–46.0)
Hemoglobin: 12.2 g/dL (ref 12.0–15.0)
MCHC: 33 g/dL (ref 30.0–36.0)
MCV: 80.5 fl (ref 78.0–100.0)
Platelets: 461 10*3/uL — ABNORMAL HIGH (ref 150.0–400.0)
RBC: 4.6 Mil/uL (ref 3.87–5.11)
RDW: 17.4 % — AB (ref 11.5–15.5)
WBC: 7.2 10*3/uL (ref 4.0–10.5)

## 2018-09-23 LAB — BASIC METABOLIC PANEL
BUN: 16 mg/dL (ref 6–23)
CHLORIDE: 98 meq/L (ref 96–112)
CO2: 31 meq/L (ref 19–32)
CREATININE: 0.94 mg/dL (ref 0.40–1.20)
Calcium: 9.8 mg/dL (ref 8.4–10.5)
GFR: 78.39 mL/min (ref >60.00)
Glucose, Bld: 144 mg/dL — ABNORMAL HIGH (ref 70–99)
Potassium: 3.5 mEq/L (ref 3.5–5.1)
Sodium: 138 mEq/L (ref 135–145)

## 2018-09-23 MED ORDER — LISINOPRIL-HYDROCHLOROTHIAZIDE 20-25 MG PO TABS: 1.0000 | ORAL_TABLET | Freq: Every day | ORAL | 3 refills | Status: DC

## 2018-09-23 NOTE — Patient Instructions (Signed)
BEFORE YOU LEAVE: -tetanus booster -labs -follow up: 1 month for blood pressure  START the new combo blood pressure medication lisinopril-hctz and take once daily, every day, in the morning.  Eat a healthy low sugar diet and get regular exercise.  We have ordered labs or studies at this visit. It can take up to 1-2 weeks for results and processing. IF results require follow up or explanation, we will call you with instructions. Clinically stable results will be released to your [facility] If you have not heard from [place] or cannot find your results in [facility] in 2 weeks please contact our office at [phone].  If you are not yet signed up for [facility], please consider signing up.

## 2018-09-25 ENCOUNTER — Encounter: Payer: Self-pay | Admitting: Family Medicine

## 2018-09-25 NOTE — Progress Notes (Signed)
Provided lab results per Dr.  Colin Benton.  Patient voices understanding.  .  Awaits for referral of Hematology to call.

## 2018-09-26 ENCOUNTER — Telehealth: Payer: Self-pay

## 2018-09-26 NOTE — Telephone Encounter (Signed)
I called the pt as I thought she may be able to talk at this time of day.  I left a message stating I will call her tomorrow.

## 2018-09-26 NOTE — Telephone Encounter (Signed)
Copied from [place] [id number]. Topic: General - Inquiry >> Sep 26, 2018  2:33 PM Margot Ables wrote: Reason for CRM: pt is requesting call from [facility] for lab results. She states she spoke with another nurse 1/22 8:00am and that she didn't feel confident in what was explained to her for results. She said that she has questions about going to hematology and "blood cells", iron, and blood sugar. Please call pt in the morning before 9:00am or between 1:00-2:00pm is her lunch hour. 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

## 2018-09-27 NOTE — Telephone Encounter (Signed)
See results note. 

## 2018-09-27 NOTE — Addendum Note (Signed)
Addended by: Agnes Lawrence on: 09/27/2018 02:11 PM   Modules accepted: Orders

## 2018-10-01 ENCOUNTER — Other Ambulatory Visit: Payer: Self-pay | Admitting: Family Medicine

## 2018-10-01 DIAGNOSIS — R928 Other abnormal and inconclusive findings on diagnostic imaging of breast: Secondary | ICD-10-CM

## 2018-10-03 ENCOUNTER — Encounter: Payer: Self-pay | Admitting: Hematology and Oncology

## 2018-10-03 ENCOUNTER — Telehealth: Payer: Self-pay | Admitting: Hematology and Oncology

## 2018-10-03 NOTE — Telephone Encounter (Signed)
A new hem appt has been scheduled for the pt to see Dr. Lindi Adie on 2/17 at 245pm. Pt aware to arrive 30 minutes early. Letter mailed.

## 2018-10-20 NOTE — Progress Notes (Signed)
HPI:  Using dictation device. Unfortunately this device frequently misinterprets words/phrases.  Here for CPE:  -Concerns and/or follow up today:  PMH significant for morbid obesity, HTN, anemia and poor compliance.Started Hctz-lisinpril for BP last visit per her preference. Referred to hematology for elevated platelets. Recommended lifestyle changes.  Has biometric screening form for completion for work. Sees gyn for pap smears, breast exams, women's health. Reports diag mammo today and visit with hematology today. Taking bp med daily. -Diet: variety of foods, balance and well rounded, reports cutting back on carbs and sweets. -Exercise: no regular exercise - but trying to move more -Taking folic acid, vitamin D or calcium: no -Diabetes and Dyslipidemia Screening: fasting for labs -Vaccines: see vaccine section EPIC -pap history: has appt march with gyn -FDLMP: see nursing notes -wants STI testing (Hep C if born 32-65): no -FH breast, colon or ovarian ca: see FH Last mammogram: 1/20; repeat today for abnormality Last colon cancer screening: n/a Breast Ca Risk Assessment: see family history and pt history DEXA (>/= 65): n/a  -Alcohol, Tobacco, drug use: see social history  Review of Systems - no reported  fevers, unintentional weight loss, vision loss, hearing loss, chest pain, sob, hemoptysis, melena, hematochezia, hematuria, genital discharge, changing or concerning skin lesions, bleeding, bruising, loc, thoughts of self harm or SI  Past Medical History:  Diagnosis Date  . Heart murmur   . History of abnormal cervical Pap smear 1993  . Hypertension   . Migraines   . Miscarriage 2010    Past Surgical History:  Procedure Laterality Date  . abdominal cyst  2010   no surgery performed-diagnosed after imaging test  . Abdominal cyst  2010   diagnosed after imaging test  . BREAST CYST ASPIRATION    . BREAST LUMPECTOMY Right 2008   benign per patient  . OTHER SURGICAL  HISTORY     Elective abortion    Family History  Problem Relation Age of Onset  . Hypertension Mother   . Hyperlipidemia Mother   . Hypertension Father   . Hyperlipidemia Father   . Heart disease Father     Social History   Socioeconomic History  . Marital status: Single    Spouse name: Not on file  . Number of children: Not on file  . Years of education: Not on file  . Highest education level: Not on file  Occupational History  . Not on file  Social Needs  . Financial resource strain: Not on file  . Food insecurity:    Worry: Not on file    Inability: Not on file  . Transportation needs:    Medical: Not on file    Non-medical: Not on file  Tobacco Use  . Smoking status: Never Smoker  . Smokeless tobacco: Never Used  Substance and Sexual Activity  . Alcohol use: Not on file  . Drug use: Not on file  . Sexual activity: Not on file  Lifestyle  . Physical activity:    Days per week: Not on file    Minutes per session: Not on file  . Stress: Not on file  Relationships  . Social connections:    Talks on phone: Not on file    Gets together: Not on file    Attends religious service: Not on file    Active member of club or organization: Not on file    Attends meetings of clubs or organizations: Not on file    Relationship status: Not on file  Other  Topics Concern  . Not on file  Social History Narrative  . Not on file     Current Outpatient Medications:  .  lisinopril-hydrochlorothiazide (PRINZIDE,ZESTORETIC) 20-25 MG tablet, Take 1 tablet by mouth daily., Disp: 90 tablet, Rfl: 3 .  amLODipine (NORVASC) 5 MG tablet, Take 1 tablet (5 mg total) by mouth daily., Disp: 90 tablet, Rfl: 3  EXAM:  Vitals:   10/21/18 1102  BP: (!) 138/94  Pulse: 68  Temp: 98.9 F (37.2 C)    GENERAL: vitals reviewed and listed below, alert, oriented, appears well hydrated and in no acute distress  HEENT: head atraumatic, PERRLA, normal appearance of eyes, ears, nose and  mouth. moist mucus membranes.  NECK: supple, no masses or lymphadenopathy  LUNGS: clear to auscultation bilaterally, no rales, rhonchi or wheeze  CV: HRRR, no peripheral edema or cyanosis, normal pedal pulses  ABDOMEN: bowel sounds normal, soft, non tender to palpation, no masses, no rebound or guarding  GU/BREAST: declined, plans to do with gyn, breast center eval today  SKIN: no rash or abnormal lesions  MS: normal gait, moves all extremities normally  NEURO: normal gait, speech and thought processing grossly intact, muscle tone grossly intact throughout  PSYCH: normal affect, pleasant and cooperative  ASSESSMENT AND PLAN:  Discussed the following assessment and plan:  PREVENTIVE EXAM: -Discussed and advised all [place] preventive services health task force level A and B recommendations for age, sex and risks. -Advised at least 150 minutes of exercise per week and a healthy diet with avoidance of (less then 1 serving per week) processed foods, white starches, red meat, fast foods and sweets and consisting of: * 5-9 servings of fresh fruits and vegetables (not corn or potatoes) *nuts and seeds, beans *olives and olive oil *lean meats such as fish and white chicken  *whole grains -labs, studies and vaccines per orders this encounter  Essential hypertension - Plan: Basic metabolic panel -better, but still high, discussed options/risks/implications -decided to add norvasc, per pt was on triple therapy in the past -lifestyle recs and wt reduction also advised, see below  Hyperlipidemia, unspecified hyperlipidemia type - Plan: Lipid panel Morbid obesity ([place]) Hyperglycemia - Plan: Hemoglobin A1c -labs, lifestyle recs, congratulate don changes and encourage to pursue healthy diet and regular exercise for life  Biometric form to Wendie Simmer to finish when labs return and fax.  Patient advised to return to clinic immediately if symptoms worsen or persist or new concerns.  Patient  Instructions  BEFORE YOU LEAVE: -form for work - hold for labs then fax Wendie Simmer -labs -follow up: 1-2 months for hypertension  Add the norvasc (amlodipine) for blood pressure and take every day. Continue your other medications.  Eat a healthy low sugar diet and get regular aerobic exercise.  We have ordered labs or studies at this visit. It can take up to 1-2 weeks for results and processing. IF results require follow up or explanation, we will call you with instructions. Clinically stable results will be released to your [facility] If you have not heard from [place] or cannot find your results in [facility] in 2 weeks please contact our office at [phone].  If you are not yet signed up for [facility], please consider signing up.    We recommend the following healthy lifestyle for LIFE: 1) Small portions. But, make sure to get regular (at least 3 per day), healthy meals and small healthy snacks if needed.  2) Eat a healthy clean diet.   TRY TO EAT: -  at least 5-7 servings of low sugar, colorful, and nutrient rich vegetables per day (not corn, potatoes or bananas.) -berries are the best choice if you wish to eat fruit (only eat small amounts if trying to reduce weight)  -lean meets (fish, white meat of chicken or [place]) -vegan proteins for some meals - beans or tofu, whole grains, nuts and seeds -Replace bad fats with good fats - good fats include: fish, nuts and seeds, canola oil, olive oil -small amounts of low fat or non fat dairy -small amounts of100 % whole grains - check the lables -drink plenty of water  AVOID: -SUGAR, sweets, anything with added sugar, corn syrup or sweeteners - must read labels as even foods advertised as "healthy" often are loaded with sugar -if you must have a sweetener, small amounts of stevia may be best -sweetened beverages and artificially sweetened beverages -simple starches (rice, bread, potatoes, pasta, chips, etc - small amounts of 100% whole grains are  ok) -red meat, pork, butter -fried foods, fast food, processed food, excessive dairy, eggs and coconut.  3)Get at least 150 minutes of sweaty aerobic exercise per week.  4)Reduce stress - consider counseling, meditation and relaxation to balance other aspects of your life.    Preventive Care 40-64 Years, Female Preventive care refers to lifestyle choices and visits with your health care provider that can promote health and wellness. What does preventive care include?   A yearly physical exam. This is also called an annual well check.  Dental exams once or twice a year.  Routine eye exams. Ask your health care provider how often you should have your eyes checked.  Personal lifestyle choices, including: ? Daily care of your teeth and gums. ? Regular physical activity. ? Eating a healthy diet. ? Avoiding tobacco and drug use. ? Limiting alcohol use. ? Practicing safe sex. ? Taking vitamin and mineral supplements as recommended by your health care provider. What happens during an annual well check? The services and screenings done by your health care provider during your annual well check will depend on your age, overall health, lifestyle risk factors, and family history of disease. Counseling Your health care provider may ask you questions about your:  Alcohol use.  Tobacco use.  Drug use.  Emotional well-being.  Home and relationship well-being.  Sexual activity.  Eating habits.  Work and work Statistician.  Method of birth control.  Menstrual cycle.  Pregnancy history. Screening You may have the following tests or measurements:  Height, weight, and BMI.  Blood pressure.  Lipid and cholesterol levels. These may be checked every 5 years, or more frequently if you are over 25 years old.  Skin check.  Lung cancer screening. You may have this screening every year starting at age 41 if you have a 30-pack-year history of smoking and currently smoke or have  quit within the past 15 years.  Colorectal cancer screening. All adults should have this screening starting at age 48 and continuing until age 65. Your health care provider may recommend screening at age 73. You will have tests every 1-10 years, depending on your results and the type of screening test. People at increased risk should start screening at an earlier age. Screening tests may include: ? Guaiac-based fecal occult blood testing. ? Fecal immunochemical test (FIT). ? Stool DNA test. ? Virtual colonoscopy. ? Sigmoidoscopy. During this test, a flexible tube with a tiny camera (sigmoidoscope) is used to examine your rectum and lower colon. The sigmoidoscope is inserted  through your anus into your rectum and lower colon. ? Colonoscopy. During this test, a long, thin, flexible tube with a tiny camera (colonoscope) is used to examine your entire colon and rectum.  Hepatitis C blood test.  Hepatitis B blood test.  Sexually transmitted disease (STD) testing.  Diabetes screening. This is done by checking your blood sugar (glucose) after you have not eaten for a while (fasting). You may have this done every 1-3 years.  Mammogram. This may be done every 1-2 years. Talk to your health care provider about when you should start having regular mammograms. This may depend on whether you have a family history of breast cancer.  BRCA-related cancer screening. This may be done if you have a family history of breast, ovarian, tubal, or peritoneal cancers.  Pelvic exam and Pap test. This may be done every 3 years starting at age 38. Starting at age 77, this may be done every 5 years if you have a Pap test in combination with an HPV test.  Bone density scan. This is done to screen for osteoporosis. You may have this scan if you are at high risk for osteoporosis. Discuss your test results, treatment options, and if necessary, the need for more tests with your health care provider. Vaccines Your health  care provider may recommend certain vaccines, such as:  Influenza vaccine. This is recommended every year.  Tetanus, diphtheria, and acellular pertussis (Tdap, Td) vaccine. You may need a Td booster every 10 years.  Varicella vaccine. You may need this if you have not been vaccinated.  Zoster vaccine. You may need this after age 73.  Measles, mumps, and rubella (MMR) vaccine. You may need at least one dose of MMR if you were born in 1957 or later. You may also need a second dose.  Pneumococcal 13-valent conjugate (PCV13) vaccine. You may need this if you have certain conditions and were not previously vaccinated.  Pneumococcal polysaccharide (PPSV23) vaccine. You may need one or two doses if you smoke cigarettes or if you have certain conditions.  Meningococcal vaccine. You may need this if you have certain conditions.  Hepatitis A vaccine. You may need this if you have certain conditions or if you travel or work in places where you may be exposed to hepatitis A.  Hepatitis B vaccine. You may need this if you have certain conditions or if you travel or work in places where you may be exposed to hepatitis B.  Haemophilus influenzae type b (Hib) vaccine. You may need this if you have certain conditions. Talk to your health care provider about which screenings and vaccines you need and how often you need them. This information is not intended to replace advice given to you by your health care provider. Make sure you discuss any questions you have with your health care provider. Document Released: 09/17/2015 Document Revised: 10/11/2017 Document Reviewed: 06/22/2015 Elsevier Interactive Patient Education  2019 [employer].        No follow-ups on file.  Lucretia Kern, DO

## 2018-10-21 ENCOUNTER — Ambulatory Visit
Admission: RE | Admit: 2018-10-21 | Discharge: 2018-10-21 | Disposition: A | Discharge status: Home / Self Care | Payer: 59 | Source: Ambulatory Visit | Attending: Family Medicine | Admitting: Family Medicine

## 2018-10-21 ENCOUNTER — Ambulatory Visit: Payer: 59 | Admitting: Hematology and Oncology

## 2018-10-21 ENCOUNTER — Other Ambulatory Visit: Payer: Self-pay | Admitting: Family Medicine

## 2018-10-21 ENCOUNTER — Encounter: Payer: Self-pay | Admitting: Family Medicine

## 2018-10-21 ENCOUNTER — Ambulatory Visit (INDEPENDENT_AMBULATORY_CARE_PROVIDER_SITE_OTHER): Payer: 59 | Admitting: Family Medicine

## 2018-10-21 VITALS — BP 138/94 | HR 68 | Temp 98.9°F | Ht 66.0 in | Wt 227.6 lb

## 2018-10-21 DIAGNOSIS — D75839 Thrombocytosis, unspecified: Secondary | ICD-10-CM | POA: Insufficient documentation

## 2018-10-21 DIAGNOSIS — E785 Hyperlipidemia, unspecified: Secondary | ICD-10-CM

## 2018-10-21 DIAGNOSIS — N6489 Other specified disorders of breast: Secondary | ICD-10-CM

## 2018-10-21 DIAGNOSIS — N6320 Unspecified lump in the left breast, unspecified quadrant: Secondary | ICD-10-CM

## 2018-10-21 DIAGNOSIS — R928 Other abnormal and inconclusive findings on diagnostic imaging of breast: Secondary | ICD-10-CM

## 2018-10-21 DIAGNOSIS — I10 Essential (primary) hypertension: Secondary | ICD-10-CM | POA: Diagnosis not present

## 2018-10-21 DIAGNOSIS — Z0000 Encounter for general adult medical examination without abnormal findings: Secondary | ICD-10-CM

## 2018-10-21 DIAGNOSIS — R739 Hyperglycemia, unspecified: Secondary | ICD-10-CM | POA: Diagnosis not present

## 2018-10-21 DIAGNOSIS — N6310 Unspecified lump in the right breast, unspecified quadrant: Secondary | ICD-10-CM

## 2018-10-21 DIAGNOSIS — D473 Essential (hemorrhagic) thrombocythemia: Secondary | ICD-10-CM | POA: Insufficient documentation

## 2018-10-21 LAB — LIPID PANEL
CHOL/HDL RATIO: 3
Cholesterol: 167 mg/dL (ref 0–200)
HDL: 53.5 mg/dL (ref >39.00)
LDL CALC: 86 mg/dL (ref 0–99)
NonHDL: 113.71
TRIGLYCERIDES: 139 mg/dL (ref 0.0–149.0)
VLDL: 27.8 mg/dL (ref 0.0–40.0)

## 2018-10-21 LAB — BASIC METABOLIC PANEL
BUN: 16 mg/dL (ref 6–23)
CALCIUM: 9.9 mg/dL (ref 8.4–10.5)
CO2: 29 mEq/L (ref 19–32)
CREATININE: 1.15 mg/dL (ref 0.40–1.20)
Chloride: 96 mEq/L (ref 96–112)
GFR: 62.1 mL/min (ref >60.00)
GLUCOSE: 105 mg/dL — AB (ref 70–99)
Potassium: 3.5 mEq/L (ref 3.5–5.1)
SODIUM: 136 meq/L (ref 135–145)

## 2018-10-21 LAB — HEMOGLOBIN A1C: Hgb A1c MFr Bld: 6.7 % — ABNORMAL HIGH (ref 4.6–6.5)

## 2018-10-21 MED ORDER — AMLODIPINE BESYLATE 5 MG PO TABS: 5.0000 mg | ORAL_TABLET | Freq: Every day | ORAL | 3 refills | Status: DC

## 2018-10-21 NOTE — Assessment & Plan Note (Signed)
Platelet count mildly increased. Lab review:  08/03/2017: Platelets 443 09/23/2018: Platelets 461, RDW 17.4, hemoglobin 12.2, MCV 80.3  Differential diagnosis 1. Primary thrombocytosis: Related to myeloproliferative disorders of the bone marrow especially essential thrombocytosis and CML. I would like to send for BCR-ABL as well as JAK-2 dictation testings. Patient understands that JAK2 mutation is only present in 50% of essential thrombocytosis so the test is advantageous only if it is positive. If it is negative, it does not rule out. 2. Secondary/reactive thrombocytosis Different causes including infections, inflammation, iron deficiency.  I would like to send out for C-reactive protein, iron studies with ferritin to complete the workup.  Treatment options: 1. If it is primary essential thrombocytosis, treatment would depend on platelet count level as well as history of thrombosis. A. For low risk patients, (platelet counts less than 1000 and no history of blood clots) the treatment would be with aspirin therapy B. for high risk patients(platelet counts greater than 1000/history of blood clot) the treatment would be platelet lowering therapy with aspirin 2. Treatment of secondary thrombocytosis would be to treat underlying cause. There would not be any risk of thrombosis with secondary thrombocytosis.  Return to clinic in 3 weeks to discuss the results of these tests.

## 2018-10-21 NOTE — Patient Instructions (Signed)
BEFORE YOU LEAVE: -form for work - hold for labs then fax Wendie Simmer -labs -follow up: 1-2 months for hypertension  Add the norvasc (amlodipine) for blood pressure and take every day. Continue your other medications.  Eat a healthy low sugar diet and get regular aerobic exercise.  We have ordered labs or studies at this visit. It can take up to 1-2 weeks for results and processing. IF results require follow up or explanation, we will call you with instructions. Clinically stable results will be released to your [facility]. If you have not heard from [place] or cannot find your results in [facility] in 2 weeks please contact our office at [phone].  If you are not yet signed up for [facility], please consider signing up.    We recommend the following healthy lifestyle for LIFE: 1) Small portions. But, make sure to get regular (at least 3 per day), healthy meals and small healthy snacks if needed.  2) Eat a healthy clean diet.   TRY TO EAT: -at least 5-7 servings of low sugar, colorful, and nutrient rich vegetables per day (not corn, potatoes or bananas.) -berries are the best choice if you wish to eat fruit (only eat small amounts if trying to reduce weight)  -lean meets (fish, white meat of chicken or [place]) -vegan proteins for some meals - beans or tofu, whole grains, nuts and seeds -Replace bad fats with good fats - good fats include: fish, nuts and seeds, canola oil, olive oil -small amounts of low fat or non fat dairy -small amounts of100 % whole grains - check the lables -drink plenty of water  AVOID: -SUGAR, sweets, anything with added sugar, corn syrup or sweeteners - must read labels as even foods advertised as "healthy" often are loaded with sugar -if you must have a sweetener, small amounts of stevia may be best -sweetened beverages and artificially sweetened beverages -simple starches (rice, bread, potatoes, pasta, chips, etc - small amounts of 100% whole grains are ok) -red meat,  pork, butter -fried foods, fast food, processed food, excessive dairy, eggs and coconut.  3)Get at least 150 minutes of sweaty aerobic exercise per week.  4)Reduce stress - consider counseling, meditation and relaxation to balance other aspects of your life.    Preventive Care 40-64 Years, Female Preventive care refers to lifestyle choices and visits with your health care provider that can promote health and wellness. What does preventive care include?   A yearly physical exam. This is also called an annual well check.  Dental exams once or twice a year.  Routine eye exams. Ask your health care provider how often you should have your eyes checked.  Personal lifestyle choices, including: ? Daily care of your teeth and gums. ? Regular physical activity. ? Eating a healthy diet. ? Avoiding tobacco and drug use. ? Limiting alcohol use. ? Practicing safe sex. ? Taking vitamin and mineral supplements as recommended by your health care provider. What happens during an annual well check? The services and screenings done by your health care provider during your annual well check will depend on your age, overall health, lifestyle risk factors, and family history of disease. Counseling Your health care provider may ask you questions about your:  Alcohol use.  Tobacco use.  Drug use.  Emotional well-being.  Home and relationship well-being.  Sexual activity.  Eating habits.  Work and work Statistician.  Method of birth control.  Menstrual cycle.  Pregnancy history. Screening You may have the following tests or  measurements:  Height, weight, and BMI.  Blood pressure.  Lipid and cholesterol levels. These may be checked every 5 years, or more frequently if you are over 78 years old.  Skin check.  Lung cancer screening. You may have this screening every year starting at age 60 if you have a 30-pack-year history of smoking and currently smoke or have quit within the  past 15 years.  Colorectal cancer screening. All adults should have this screening starting at age 53 and continuing until age 26. Your health care provider may recommend screening at age 24. You will have tests every 1-10 years, depending on your results and the type of screening test. People at increased risk should start screening at an earlier age. Screening tests may include: ? Guaiac-based fecal occult blood testing. ? Fecal immunochemical test (FIT). ? Stool DNA test. ? Virtual colonoscopy. ? Sigmoidoscopy. During this test, a flexible tube with a tiny camera (sigmoidoscope) is used to examine your rectum and lower colon. The sigmoidoscope is inserted through your anus into your rectum and lower colon. ? Colonoscopy. During this test, a long, thin, flexible tube with a tiny camera (colonoscope) is used to examine your entire colon and rectum.  Hepatitis C blood test.  Hepatitis B blood test.  Sexually transmitted disease (STD) testing.  Diabetes screening. This is done by checking your blood sugar (glucose) after you have not eaten for a while (fasting). You may have this done every 1-3 years.  Mammogram. This may be done every 1-2 years. Talk to your health care provider about when you should start having regular mammograms. This may depend on whether you have a family history of breast cancer.  BRCA-related cancer screening. This may be done if you have a family history of breast, ovarian, tubal, or peritoneal cancers.  Pelvic exam and Pap test. This may be done every 3 years starting at age 43. Starting at age 57, this may be done every 5 years if you have a Pap test in combination with an HPV test.  Bone density scan. This is done to screen for osteoporosis. You may have this scan if you are at high risk for osteoporosis. Discuss your test results, treatment options, and if necessary, the need for more tests with your health care provider. Vaccines Your health care provider may  recommend certain vaccines, such as:  Influenza vaccine. This is recommended every year.  Tetanus, diphtheria, and acellular pertussis (Tdap, Td) vaccine. You may need a Td booster every 10 years.  Varicella vaccine. You may need this if you have not been vaccinated.  Zoster vaccine. You may need this after age 79.  Measles, mumps, and rubella (MMR) vaccine. You may need at least one dose of MMR if you were born in 1957 or later. You may also need a second dose.  Pneumococcal 13-valent conjugate (PCV13) vaccine. You may need this if you have certain conditions and were not previously vaccinated.  Pneumococcal polysaccharide (PPSV23) vaccine. You may need one or two doses if you smoke cigarettes or if you have certain conditions.  Meningococcal vaccine. You may need this if you have certain conditions.  Hepatitis A vaccine. You may need this if you have certain conditions or if you travel or work in places where you may be exposed to hepatitis A.  Hepatitis B vaccine. You may need this if you have certain conditions or if you travel or work in places where you may be exposed to hepatitis B.  Haemophilus influenzae type  b (Hib) vaccine. You may need this if you have certain conditions. Talk to your health care provider about which screenings and vaccines you need and how often you need them. This information is not intended to replace advice given to you by your health care provider. Make sure you discuss any questions you have with your health care provider. Document Released: 09/17/2015 Document Revised: 10/11/2017 Document Reviewed: 06/22/2015 Elsevier Interactive Patient Education  2019 [employer].

## 2018-10-23 ENCOUNTER — Telehealth: Payer: Self-pay | Admitting: Hematology and Oncology

## 2018-10-23 NOTE — Telephone Encounter (Signed)
Pt has been cld and rescheduled to see Dr. Lindi Adie on 2/26 at 345pm. She's agreed to the appt date and time.

## 2018-10-24 ENCOUNTER — Ambulatory Visit: Payer: 59 | Admitting: Family Medicine

## 2018-10-24 ENCOUNTER — Telehealth: Payer: Self-pay | Admitting: *Deleted

## 2018-10-24 NOTE — Telephone Encounter (Signed)
Copied from [place] [id number]. Topic: General - Other >> Oct 22, 2018  8:02 AM Yvette Rack wrote: Reason for CRM: pt returning  [facility] call from yesterday

## 2018-10-24 NOTE — Telephone Encounter (Signed)
See results note. 

## 2018-10-29 NOTE — Progress Notes (Signed)
[place] CONSULT NOTE  Patient Care Team: Lucretia Kern, DO as PCP - General (Family Medicine)  CHIEF COMPLAINTS/PURPOSE OF CONSULTATION: Newly diagnosed thrombocytosis  HISTORY OF PRESENTING ILLNESS:  Crystal Nash 43 y.o. female is here because of recent diagnosis of thrombocytosis. Her most recent labs from 09/23/18 showed platelets 461, Hg 12.2, RDW 17.4, MCV 80.3, WBC 7.2. She presents to the clinic alone today. She notes a history of sinus inflammation and irritation. She reports about two years ago she saw a physician who recommended she see a hematologist for concerns about myeloma. She notes a history of heavy menstruation.   I reviewed her records extensively and collaborated the history with the patient.  MEDICAL HISTORY:  Past Medical History:  Diagnosis Date  . Heart murmur   . History of abnormal cervical Pap smear 1993  . Hypertension   . Migraines   . Miscarriage 2010    SURGICAL HISTORY: Past Surgical History:  Procedure Laterality Date  . abdominal cyst  2010   no surgery performed-diagnosed after imaging test  . Abdominal cyst  2010   diagnosed after imaging test  . BREAST CYST ASPIRATION    . BREAST LUMPECTOMY Right 2008   benign per patient  . OTHER SURGICAL HISTORY     Elective abortion    SOCIAL HISTORY: Social History   Socioeconomic History  . Marital status: Single    Spouse name: Not on file  . Number of children: Not on file  . Years of education: Not on file  . Highest education level: Not on file  Occupational History  . Not on file  Social Needs  . Financial resource strain: Not on file  . Food insecurity:    Worry: Not on file    Inability: Not on file  . Transportation needs:    Medical: Not on file    Non-medical: Not on file  Tobacco Use  . Smoking status: Never Smoker  . Smokeless tobacco: Never Used  Substance and Sexual Activity  . Alcohol use: Not on file  . Drug use: Not on file  . Sexual  activity: Not on file  Lifestyle  . Physical activity:    Days per week: Not on file    Minutes per session: Not on file  . Stress: Not on file  Relationships  . Social connections:    Talks on phone: Not on file    Gets together: Not on file    Attends religious service: Not on file    Active member of club or organization: Not on file    Attends meetings of clubs or organizations: Not on file    Relationship status: Not on file  . Intimate partner violence:    Fear of current or ex partner: Not on file    Emotionally abused: Not on file    Physically abused: Not on file    Forced sexual activity: Not on file  Other Topics Concern  . Not on file  Social History Narrative  . Not on file    FAMILY HISTORY: Family History  Problem Relation Age of Onset  . Hypertension Mother   . Hyperlipidemia Mother   . Hypertension Father   . Hyperlipidemia Father   . Heart disease Father     ALLERGIES:  has No Known Allergies.  MEDICATIONS:  Current Outpatient Medications  Medication Sig Dispense Refill  . amLODipine (NORVASC) 5 MG tablet Take 1 tablet (5 mg total) by mouth daily. [place]  tablet 3  . lisinopril-hydrochlorothiazide (PRINZIDE,ZESTORETIC) 20-25 MG tablet Take 1 tablet by mouth daily. 90 tablet 3   No current facility-administered medications for this visit.     REVIEW OF SYSTEMS:   Constitutional: Denies fevers, chills or abnormal night sweats Eyes: Denies blurriness of vision, double vision or watery eyes Ears, nose, mouth, throat, and face: Denies mucositis or sore throat Respiratory: Denies cough, dyspnea or wheezes Cardiovascular: Denies palpitation, chest discomfort or lower extremity swelling Gastrointestinal:  Denies nausea, heartburn or change in bowel habits Skin: Denies abnormal skin rashes GYN: (+) heavy menstrual cycles Lymphatics: Denies new lymphadenopathy or easy bruising Neurological:Denies numbness, tingling or new weaknesses Behavioral/Psych: Mood  is stable, no new changes  Breast: Denies any palpable lumps or discharge All other systems were reviewed with the patient and are negative.  PHYSICAL EXAMINATION: ECOG PERFORMANCE STATUS: 0 - Asymptomatic  Vitals:   10/30/18 1526  BP: (!) 151/86  Pulse: 97  Resp: 18  Temp: 98.4 F (36.9 C)  SpO2: 100%   Filed Weights   10/30/18 1526  Weight: 230 lb 8 oz (104.6 kg)    GENERAL:alert, no distress and comfortable SKIN: skin color, texture, turgor are normal, no rashes or significant lesions EYES: normal, conjunctiva are pink and non-injected, sclera clear OROPHARYNX:no exudate, no erythema and lips, buccal mucosa, and tongue normal  NECK: supple, thyroid normal size, non-tender, without nodularity LYMPH:  no palpable lymphadenopathy in the cervical, axillary or inguinal LUNGS: clear to auscultation and percussion with normal breathing effort HEART: regular rate & rhythm and no murmurs and no lower extremity edema ABDOMEN:abdomen soft, non-tender and normal bowel sounds Musculoskeletal:no cyanosis of digits and no clubbing  PSYCH: alert & oriented x 3 with fluent speech NEURO: no focal motor/sensory deficits  LABORATORY DATA:  I have reviewed the data as listed Lab Results  Component Value Date   WBC 7.2 09/23/2018   HGB 12.2 09/23/2018   HCT 37.0 09/23/2018   MCV 80.5 09/23/2018   PLT 461.0 (H) 09/23/2018   Lab Results  Component Value Date   NA 136 10/21/2018   K 3.5 10/21/2018   CL 96 10/21/2018   CO2 29 10/21/2018    RADIOGRAPHIC STUDIES: I have personally reviewed the radiological reports and agreed with the findings in the report.  ASSESSMENT AND PLAN:  Thrombocytosis (HCC) Platelet count mildly increased. Lab review:  08/03/2017: Platelets 443 09/23/2018: Platelets 461, RDW 17.4, hemoglobin 12.2, MCV 80.3  Differential diagnosis 1. Primary thrombocytosis: Related to myeloproliferative disorders of the bone marrow especially essential thrombocytosis and  CML. I would like to send for BCR-ABL as well as JAK-2 dictation testings. Patient understands that JAK2 mutation is only present in 50% of essential thrombocytosis so the test is advantageous only if it is positive. If it is negative, it does not rule out. 2. Secondary/reactive thrombocytosis Different causes including infections, inflammation, iron deficiency.  I would like to send out for C-reactive protein, iron studies with ferritin to complete the workup.  Treatment options: Depends on the cause primary versus secondary. 1. If it is primary essential thrombocytosis, treatment would depend on platelet count level as well as history of thrombosis. A. For low risk patients, (platelet counts less than 1000 and no history of blood clots) the treatment would be with aspirin therapy B. for high risk patients(platelet counts greater than 1000/history of blood clot) the treatment would be platelet lowering therapy with aspirin 2. Treatment of secondary thrombocytosis would be to treat underlying cause. There would not  be any risk of thrombosis with secondary thrombocytosis.  Return to clinic in 2 weeks to discuss the results of these tests.   All questions were answered. The patient knows to call the clinic with any problems, questions or concerns.   Nicholas Lose, MD 10/30/2018   Julious Oka Dorshimer, am acting as scribe for Nicholas Lose, MD.  I have reviewed the above documentation for accuracy and completeness, and I agree with the above.

## 2018-10-30 ENCOUNTER — Inpatient Hospital Stay: Payer: 59 | Attending: Hematology and Oncology | Admitting: Hematology and Oncology

## 2018-10-30 ENCOUNTER — Inpatient Hospital Stay: Payer: 59

## 2018-10-30 DIAGNOSIS — D75839 Thrombocytosis, unspecified: Secondary | ICD-10-CM

## 2018-10-30 DIAGNOSIS — D473 Essential (hemorrhagic) thrombocythemia: Secondary | ICD-10-CM

## 2018-10-30 DIAGNOSIS — I10 Essential (primary) hypertension: Secondary | ICD-10-CM | POA: Diagnosis not present

## 2018-10-30 LAB — CBC WITH DIFFERENTIAL (CANCER CENTER ONLY)
Abs Immature Granulocytes: 0.03 10*3/uL (ref 0.00–0.07)
BASOS PCT: 1 %
Basophils Absolute: 0.1 10*3/uL (ref 0.0–0.1)
EOS PCT: 3 %
Eosinophils Absolute: 0.2 10*3/uL (ref 0.0–0.5)
HCT: 34.6 % — ABNORMAL LOW (ref 36.0–46.0)
Hemoglobin: 10.8 g/dL — ABNORMAL LOW (ref 12.0–15.0)
Immature Granulocytes: 0 %
Lymphocytes Relative: 26 %
Lymphs Abs: 2.1 10*3/uL (ref 0.7–4.0)
MCH: 26.2 pg (ref 26.0–34.0)
MCHC: 31.2 g/dL (ref 30.0–36.0)
MCV: 83.8 fL (ref 80.0–100.0)
MONO ABS: 0.4 10*3/uL (ref 0.1–1.0)
Monocytes Relative: 6 %
Neutro Abs: 5.1 10*3/uL (ref 1.7–7.7)
Neutrophils Relative %: 64 %
Platelet Count: 393 10*3/uL (ref 150–400)
RBC: 4.13 MIL/uL (ref 3.87–5.11)
RDW: 16.7 % — ABNORMAL HIGH (ref 11.5–15.5)
WBC: 8 10*3/uL (ref 4.0–10.5)
nRBC: 0 % (ref 0.0–0.2)

## 2018-10-30 LAB — C-REACTIVE PROTEIN

## 2018-10-30 NOTE — Assessment & Plan Note (Signed)
Platelet count mildly increased. Lab review:  08/03/2017: Platelets 443 09/23/2018: Platelets 461, RDW 17.4, hemoglobin 12.2, MCV 80.3  Differential diagnosis 1. Primary thrombocytosis: Related to myeloproliferative disorders of the bone marrow especially essential thrombocytosis and CML. I would like to send for BCR-ABL as well as JAK-2 dictation testings. Patient understands that JAK2 mutation is only present in 50% of essential thrombocytosis so the test is advantageous only if it is positive. If it is negative, it does not rule out. 2. Secondary/reactive thrombocytosis Different causes including infections, inflammation, iron deficiency.  I would like to send out for C-reactive protein, iron studies with ferritin to complete the workup.  Treatment options: 1. If it is primary essential thrombocytosis, treatment would depend on platelet count level as well as history of thrombosis. A. For low risk patients, (platelet counts less than 1000 and no history of blood clots) the treatment would be with aspirin therapy B. for high risk patients(platelet counts greater than 1000/history of blood clot) the treatment would be platelet lowering therapy with aspirin 2. Treatment of secondary thrombocytosis would be to treat underlying cause. There would not be any risk of thrombosis with secondary thrombocytosis.  Return to clinic in 3 weeks to discuss the results of these tests.

## 2018-10-31 LAB — IRON AND TIBC
Iron: 42 ug/dL (ref 28–170)
SATURATION RATIOS: 12 % — AB (ref 21–57)
TIBC: 412 ug/dL (ref 250–450)
UIBC: 328 ug/dL (ref 120–384)

## 2018-10-31 LAB — FERRITIN: Ferritin: 37 ng/mL (ref 11–307)

## 2018-11-01 ENCOUNTER — Encounter: Payer: Self-pay | Admitting: Hematology and Oncology

## 2018-11-06 DIAGNOSIS — I10 Essential (primary) hypertension: Secondary | ICD-10-CM

## 2018-11-06 DIAGNOSIS — E118 Type 2 diabetes mellitus with unspecified complications: Secondary | ICD-10-CM | POA: Insufficient documentation

## 2018-11-06 DIAGNOSIS — E1159 Type 2 diabetes mellitus with other circulatory complications: Secondary | ICD-10-CM | POA: Insufficient documentation

## 2018-11-06 NOTE — Progress Notes (Deleted)
  HPI:  Using dictation device. Unfortunately this device frequently misinterprets words/phrases.  Crystal Nash is a pleasant 43 y.o. here for follow up. Chronic medical problems summarized below were reviewed for changes and stability and were updated as needed below. New Hgba1c in diabetes range on labs a few weeks ago. Advised lifestyle changes, diabetic eye xam, strict adherence to meds and follow up (today) for discussion other implications, treatment options.  Seeing hematology for thrombocytosis. Denies CP, SOB, DOE, treatment intolerance or new symptoms.  HTN: -started lisinopril - hctz 09/2018  Morbid Obesity/Mild Diabetes: -advised wt reduction, lifetsyle changes -hx poor compliance -diabetes found on labs 10/21/18   Thrombocytosis: -seeing hematology for evaluation  ROS: See pertinent positives and negatives per HPI.  Past Medical History:  Diagnosis Date  . Heart murmur   . History of abnormal cervical Pap smear 1993  . Hypertension   . Migraines   . Miscarriage 2010    Past Surgical History:  Procedure Laterality Date  . abdominal cyst  2010   no surgery performed-diagnosed after imaging test  . Abdominal cyst  2010   diagnosed after imaging test  . BREAST CYST ASPIRATION    . BREAST LUMPECTOMY Right 2008   benign per patient  . OTHER SURGICAL HISTORY     Elective abortion    Family History  Problem Relation Age of Onset  . Hypertension Mother   . Hyperlipidemia Mother   . Hypertension Father   . Hyperlipidemia Father   . Heart disease Father     SOCIAL HX: ***   Current Outpatient Medications:  .  amLODipine (NORVASC) 5 MG tablet, Take 1 tablet (5 mg total) by mouth daily., Disp: 90 tablet, Rfl: 3 .  lisinopril-hydrochlorothiazide (PRINZIDE,ZESTORETIC) 20-25 MG tablet, Take 1 tablet by mouth daily., Disp: 90 tablet, Rfl: 3  EXAM:  There were no vitals filed for this visit.  There is no height or weight on file to calculate  BMI.  GENERAL: vitals reviewed and listed above, alert, oriented, appears well hydrated and in no acute distress  HEENT: atraumatic, conjunttiva clear, no obvious abnormalities on inspection of external nose and ears  NECK: no obvious masses on inspection  LUNGS: clear to auscultation bilaterally, no wheezes, rales or rhonchi, good air movement  CV: HRRR, no peripheral edema  MS: moves all extremities without noticeable abnormality *** PSYCH: pleasant and cooperative, no obvious depression or anxiety  ASSESSMENT AND PLAN:  Discussed the following assessment and plan:  No diagnosis found.  *** -Patient advised to return or notify a doctor immediately if symptoms worsen or persist or new concerns arise.  There are no Patient Instructions on file for this visit.  Lucretia Kern, DO

## 2018-11-07 ENCOUNTER — Encounter: Payer: 59 | Admitting: Family Medicine

## 2018-11-07 ENCOUNTER — Ambulatory Visit: Payer: 59 | Admitting: Family Medicine

## 2018-11-13 LAB — JAK2 (INCLUDING V617F AND EXON 12), MPL,& CALR W/RFL MPN PANEL (NGS)

## 2018-11-14 ENCOUNTER — Inpatient Hospital Stay: Payer: 59 | Attending: Hematology and Oncology | Admitting: Hematology and Oncology

## 2018-11-19 ENCOUNTER — Ambulatory Visit: Payer: 59 | Admitting: Family Medicine

## 2018-11-20 ENCOUNTER — Telehealth: Payer: Self-pay

## 2018-11-20 ENCOUNTER — Encounter: Payer: Self-pay | Admitting: *Deleted

## 2018-11-20 NOTE — Progress Notes (Deleted)
  Crystal Nash DOB: March 14, 1976 Encounter date: 11/22/2018  This is a 43 y.o. female who presents with No chief complaint on file.   History of present illness:  HPI   No Known Allergies No outpatient medications have been marked as taking for the 11/22/18 encounter (Appointment) with Crystal Macadam, MD.    Review of Systems  Objective:  There were no vitals taken for this visit.      BP Readings from Last 3 Encounters:  10/30/18 (!) 151/86  10/21/18 (!) 138/94  09/23/18 (!) 160/90   Wt Readings from Last 3 Encounters:  10/30/18 230 lb 8 oz (104.6 kg)  10/21/18 227 lb 9.6 oz (103.2 kg)  09/23/18 232 lb (105.2 kg)    Physical Exam  Assessment/Plan  There are no diagnoses linked to this encounter.       Crystal Rough, MD

## 2018-11-20 NOTE — Telephone Encounter (Signed)
Spoke with pt and she is requesting a letter stating she was seen here in February for OV, is being treated for [facility] and was referred to [facility] for treatment of blood disorder. She states she missed so much work in February she was unable to pay her rent in March and she will be evicted without this letter showing why she missed work. She initially wanted letter saying she was out of work multiple days in February and March due to her blood pressure, but I advised we did not take her out of work and she did not call us for recommendations or to advise she was having to miss work so we would not be able to provide that type of documentation. Pt advised we can ask for letter stating she was seen in February, was treated for [facility] and was referred out to specialist. She states that will be fine.

## 2018-11-20 NOTE — Telephone Encounter (Signed)
Copied from [place] [id number]. Topic: General - Other >> Nov 20, 2018  9:25 AM Ivar Drape wrote: Reason for CRM:   Patient would like a call back from Dr. Julianne Rice medical assistant in regards a letter to her employer about her doctor's care.

## 2018-11-20 NOTE — Telephone Encounter (Signed)
I called the pt and informed her of the message below.  She stated she could not go to work on 3/2 and 3/9-3/12/20 due to headaches and she works on a Teaching laboratory technician. Dr Ethlyn Gallery was informed of this and approved these dates to be added to the note.  I informed the pt a letter was completed with the information as stated and left at the front desk for pick up.

## 2018-11-20 NOTE — Telephone Encounter (Signed)
It would be ok to write letter and list appointment dates with Dr. Maudie Mercury as well as hematologist that are in the system. If you can get details about how many other dates were missed? (Ie if she missed days related to adjusting to new bp medication) we can mention that she is being treated for hypertension as well as under evaluation for a blood disorder and that there was an adjustment period to new medications. Not sure how much more she needs for work. Will be difficult to cover her for much more since I do not see documented that she was having bad reaction or missing work related to above conditions?   Let me know if you need additional help with letter. I think if we can document some of issues she has had today that would be helpful.

## 2018-11-21 ENCOUNTER — Ambulatory Visit: Payer: 59 | Admitting: Family Medicine

## 2018-11-22 ENCOUNTER — Ambulatory Visit: Payer: 59 | Admitting: Family Medicine

## 2018-11-27 ENCOUNTER — Ambulatory Visit (INDEPENDENT_AMBULATORY_CARE_PROVIDER_SITE_OTHER): Payer: 59 | Admitting: Licensed Clinical Social Worker

## 2018-11-27 DIAGNOSIS — F331 Major depressive disorder, recurrent, moderate: Secondary | ICD-10-CM

## 2018-12-04 ENCOUNTER — Ambulatory Visit (INDEPENDENT_AMBULATORY_CARE_PROVIDER_SITE_OTHER): Payer: 59 | Admitting: Licensed Clinical Social Worker

## 2018-12-04 DIAGNOSIS — F331 Major depressive disorder, recurrent, moderate: Secondary | ICD-10-CM

## 2018-12-06 ENCOUNTER — Ambulatory Visit: Payer: 59 | Admitting: Licensed Clinical Social Worker

## 2018-12-10 ENCOUNTER — Ambulatory Visit (INDEPENDENT_AMBULATORY_CARE_PROVIDER_SITE_OTHER): Payer: 59 | Admitting: Licensed Clinical Social Worker

## 2018-12-10 DIAGNOSIS — F331 Major depressive disorder, recurrent, moderate: Secondary | ICD-10-CM | POA: Diagnosis not present

## 2018-12-16 ENCOUNTER — Ambulatory Visit (INDEPENDENT_AMBULATORY_CARE_PROVIDER_SITE_OTHER): Payer: 59 | Admitting: Family Medicine

## 2018-12-16 ENCOUNTER — Telehealth: Payer: Self-pay | Admitting: *Deleted

## 2018-12-16 ENCOUNTER — Encounter: Payer: Self-pay | Admitting: Family Medicine

## 2018-12-16 ENCOUNTER — Other Ambulatory Visit: Payer: Self-pay

## 2018-12-16 DIAGNOSIS — I10 Essential (primary) hypertension: Secondary | ICD-10-CM | POA: Diagnosis not present

## 2018-12-16 DIAGNOSIS — F419 Anxiety disorder, unspecified: Secondary | ICD-10-CM

## 2018-12-16 DIAGNOSIS — E1159 Type 2 diabetes mellitus with other circulatory complications: Secondary | ICD-10-CM | POA: Diagnosis not present

## 2018-12-16 MED ORDER — DULOXETINE HCL 20 MG PO CPEP: 20.0000 mg | ORAL_CAPSULE | Freq: Every day | ORAL | 0 refills | Status: DC

## 2018-12-16 MED ORDER — BLOOD PRESSURE KIT DEVI: 1.0000 | 0 refills | Status: AC

## 2018-12-16 NOTE — Addendum Note (Signed)
Addended by: Agnes Lawrence on: 12/16/2018 02:22 PM   Modules accepted: Orders

## 2018-12-16 NOTE — Telephone Encounter (Signed)
Per office notes from 4/13, I called the pt and informed her the medication list, along with the Rx for a blood pressure device/kit was left at the front desk for pick up.  Appt scheduled with Dr Volanda Napoleon for transfer of care visit on 4/29.

## 2018-12-16 NOTE — Progress Notes (Signed)
Virtual Visit via Video Note  I connected with Crystal Nash  on 12/16/18 at 11:00 AM EDT by a video enabled telemedicine application and verified that I am speaking with the correct person using two identifiers.  Location patient: home Location provider:work or home office Persons participating in the virtual visit: patient, provider  I discussed the limitations of evaluation and management by telemedicine and the availability of in person appointments. The patient expressed understanding and agreed to proceed.   HPI:  Acute visit for Anxiety:  -she has been having a lot of anxiety since Feb of 2020, generalized anxiety, denies depression -occ panic attacks a few per week -3 friends passed away and her mother has pancreatic cancer; she can't visit her mother given the COVID 19 pandemic -seeing Marya Amsler, for CBT - reports Almyra Free wrote her out of work for this for now, though she feels symptoms are mild, it is just hard to work with all of this going on -has been on medications for anxiety in the past and took Cymbalta - she felt like this really helped in the past, no side effect -denies: SI, thoughts of harm, hallucinations, manic symptoms She wants another BP cuff. Reports taking her BP medications. She reports had a BP check last week at gyn office and BP was 119/80. No CP, SOB, DOE.  ROS: See pertinent positives and negatives per HPI.  Past Medical History:  Diagnosis Date  . Heart murmur   . History of abnormal cervical Pap smear 1993  . Hypertension   . Migraines   . Miscarriage 2010    Past Surgical History:  Procedure Laterality Date  . abdominal cyst  2010   no surgery performed-diagnosed after imaging test  . Abdominal cyst  2010   diagnosed after imaging test  . BREAST CYST ASPIRATION    . BREAST LUMPECTOMY Right 2008   benign per patient  . OTHER SURGICAL HISTORY     Elective abortion    Family History  Problem Relation Age of Onset  . Hypertension Mother   .  Hyperlipidemia Mother   . Hypertension Father   . Hyperlipidemia Father   . Heart disease Father     SOCIAL HX: see hpi   Current Outpatient Medications:  .  amLODipine (NORVASC) 5 MG tablet, Take 1 tablet (5 mg total) by mouth daily., Disp: 90 tablet, Rfl: 3 .  lisinopril-hydrochlorothiazide (PRINZIDE,ZESTORETIC) 20-25 MG tablet, Take 1 tablet by mouth daily., Disp: 90 tablet, Rfl: 3 .  DULoxetine (CYMBALTA) 20 MG capsule, Take 1 capsule (20 mg total) by mouth daily., Disp: 30 capsule, Rfl: 0  EXAM:  VITALS per patient if applicable: none  GENERAL: alert, oriented, appears well and in no acute distress  HEENT: atraumatic, conjunttiva clear, no obvious abnormalities on inspection of external nose and ears  NECK: normal movements of the head and neck  LUNGS: on inspection no signs of respiratory distress, breathing rate appears normal, no obvious gross SOB, gasping or wheezing  CV: no obvious cyanosis  MS: moves all visible extremities without noticeable abnormality  PSYCH/NEURO: pleasant and cooperative, no obvious depression or anxiety, speech and thought processing grossly intact  ASSESSMENT AND PLAN:  Discussed the following assessment and plan:  Anxiety - Plan: Ambulatory referral to Psychiatry  Hypertension associated with diabetes ([place])  -counseled and supported. She has had a lot going on. -we had a lengthy discussion about the treatment options, risks, management of anxiety. She feels like cymbalta really helped I the past and wanted  to try it again. Discussed risks. -also placed referral to psychiatry per her preference and given symptoms interfering with ability to work.  -will have assistant print med list she requested and an order for a new BP cuff as she reports her old one was destroyed by hand cream. -she will need close follow up and advised a TOC visit in 2 weeks to check in and see how she is doing. She agreed.   I discussed the assessment and  treatment plan with the patient. The patient was provided an opportunity to ask questions and all were answered. The patient agreed with the plan and demonstrated an understanding of the instructions.   The patient was advised to call back or seek an in-person evaluation if the symptoms worsen or if the condition fails to improve as anticipated.   Follow up instructions: Advised assistant Wendie Simmer to help patient arrange the following: -can you print a copy of her medications for her to pick up -can you also write a order for her blood pressure and use signature stamp  -TOC with Dr. Volanda Napoleon, Ethlyn Gallery or Ken[place] in 2 weeks  Lucretia Kern, DO

## 2018-12-17 ENCOUNTER — Ambulatory Visit (INDEPENDENT_AMBULATORY_CARE_PROVIDER_SITE_OTHER): Payer: 59 | Admitting: Licensed Clinical Social Worker

## 2018-12-17 DIAGNOSIS — F3341 Major depressive disorder, recurrent, in partial remission: Secondary | ICD-10-CM

## 2018-12-31 ENCOUNTER — Ambulatory Visit: Payer: Self-pay | Admitting: Licensed Clinical Social Worker

## 2019-01-01 ENCOUNTER — Ambulatory Visit (INDEPENDENT_AMBULATORY_CARE_PROVIDER_SITE_OTHER): Payer: 59 | Admitting: Family Medicine

## 2019-01-01 ENCOUNTER — Encounter: Payer: 59 | Admitting: Family Medicine

## 2019-01-01 ENCOUNTER — Other Ambulatory Visit: Payer: Self-pay

## 2019-01-01 ENCOUNTER — Encounter: Payer: Self-pay | Admitting: Family Medicine

## 2019-01-01 DIAGNOSIS — K219 Gastro-esophageal reflux disease without esophagitis: Secondary | ICD-10-CM | POA: Diagnosis not present

## 2019-01-01 DIAGNOSIS — J302 Other seasonal allergic rhinitis: Secondary | ICD-10-CM

## 2019-01-01 DIAGNOSIS — F339 Major depressive disorder, recurrent, unspecified: Secondary | ICD-10-CM

## 2019-01-01 DIAGNOSIS — I10 Essential (primary) hypertension: Secondary | ICD-10-CM | POA: Diagnosis not present

## 2019-01-01 MED ORDER — HYDROCHLOROTHIAZIDE 25 MG PO TABS: 25.0000 mg | ORAL_TABLET | Freq: Every day | ORAL | 3 refills | Status: DC

## 2019-01-01 MED ORDER — AMLODIPINE BESYLATE 10 MG PO TABS: 10.0000 mg | ORAL_TABLET | Freq: Every day | ORAL | 3 refills | Status: DC

## 2019-01-01 MED ORDER — CETIRIZINE HCL 10 MG PO TABS: 10.0000 mg | ORAL_TABLET | Freq: Every day | ORAL | 11 refills | Status: AC

## 2019-01-01 MED ORDER — BLOOD PRESSURE MONITOR KIT: PACK | 0 refills | Status: AC

## 2019-01-01 NOTE — Progress Notes (Signed)
Virtual Visit via Video Note  I connected with Crystal Nash on 01/01/19 at  1:00 PM EDT by a video enabled telemedicine application and verified that I am speaking with the correct person using two identifiers.  Location patient: in car in clinic parking lot Location provider: home office Persons participating in the virtual visit: patient, provider  I discussed the limitations of evaluation and management by telemedicine and the availability of in person appointments. The patient expressed understanding and agreed to proceed.   HPI: Pt is a 43 yo female following up on chronic conditions and TOC, previously seen by Dr. Maudie Mercury.  Pt upset.  States she drove 2.5 hrs to clinic from her in-laws as she was not aware appointment was an [employer].   Pt states she will "look for another provider in network".  HTN:  Taking med but not norvasc 5 mg and lisinopril-HCTZ 20-25 mg daily.  Pt is not exercising.  Drinking some water daily.  Does not check bp at home.  Was written a rx for a bp cuff but lost it.  "Manic depression":  On short term disability.  Schedule to go back to work on in the next month or so.  States currently seeing a therapist, but will need to see a Psychiatrist in order to continue disability.  Pt states a referral was placed by Dr. Maudie Mercury, but she has not heard anything else about it.  Currently taking Cymbalta 20 mg daily.  Stomach issues: mentions recent issues with stomach cramps.  States has heart burn and tums no longer help.  Notices symptoms with pizza sauce and other tomato based sauces.  Eating late at night (9pm).  Pt also mentions allergy issues.  States takes 2 of her husband's zyrtec daily.  Requesting a prescription.  Recently seen by OB/Gyn.  Trying to get pregnant.  Was taking a medication to help, but cannot remember the name.    ROS: See pertinent positives and negatives per HPI.  Past Medical History:  Diagnosis Date  . Heart murmur   . History of abnormal  cervical Pap smear 1993  . Hypertension   . Migraines   . Miscarriage 2010    Past Surgical History:  Procedure Laterality Date  . abdominal cyst  2010   no surgery performed-diagnosed after imaging test  . Abdominal cyst  2010   diagnosed after imaging test  . BREAST CYST ASPIRATION    . BREAST LUMPECTOMY Right 2008   benign per patient  . OTHER SURGICAL HISTORY     Elective abortion    Family History  Problem Relation Age of Onset  . Hypertension Mother   . Hyperlipidemia Mother   . Hypertension Father   . Hyperlipidemia Father   . Heart disease Father     SOCIAL HX:   Current Outpatient Medications:  .  amLODipine (NORVASC) 10 MG tablet, Take 1 tablet (10 mg total) by mouth daily., Disp: 30 tablet, Rfl: 3 .  Blood Pressure Monitor KIT, Use as directed for daily monitoring, Disp: 1 each, Rfl: 0 .  Blood Pressure Monitoring (BLOOD PRESSURE KIT) DEVI, 1 each by Other route as directed. To check blood pressure, Disp: 1 Device, Rfl: 0 .  cetirizine (ZYRTEC) 10 MG tablet, Take 1 tablet (10 mg total) by mouth daily., Disp: 30 tablet, Rfl: 11 .  DULoxetine (CYMBALTA) 20 MG capsule, Take 1 capsule (20 mg total) by mouth daily., Disp: 30 capsule, Rfl: 0 .  hydrochlorothiazide (HYDRODIURIL) 25 MG tablet, Take 1  tablet (25 mg total) by mouth daily., Disp: 30 tablet, Rfl: 3  EXAM:  VITALS per patient if applicable:  GENERAL: alert, oriented, appears well and in no acute distress  HEENT: atraumatic, conjunctiva clear, no obvious abnormalities on inspection of external nose and ears  NECK: normal movements of the head and neck  LUNGS: on inspection no signs of respiratory distress, breathing rate appears normal, no obvious gross SOB, gasping or wheezing  CV: no obvious cyanosis  MS: moves all visible extremities without noticeable abnormality  PSYCH/NEURO: pleasant and cooperative, no obvious depression or anxiety, speech and thought processing grossly intact  ASSESSMENT  AND PLAN:  Discussed the following assessment and plan:  Essential hypertension  -discussed d/c'ing lisinopril as pt trying to become pregnant. -will increase Norvasc from 5 mg to 10 mg. -will have pt check bp daily and keep a log.   -will plan to decrease dose of HCTZ in the future.  Discussed may need to further adjust bp medication(s) -discussed lifestyle modifications - Plan: amLODipine (NORVASC) 10 MG tablet, Blood Pressure Monitor KIT, hydrochlorothiazide (HYDRODIURIL) 25 MG tablet  Seasonal allergies  - Plan: cetirizine (ZYRTEC) 10 MG tablet  Gastroesophageal reflux disease, esophagitis presence not specified -discussed foods known to cause symptoms -consider daily PPI  Depression, recurrent ([place]) -continue cymbalta -continue f/u with therapist.  Will check on psych referral.  F/u prn in the next month   I discussed the assessment and treatment plan with the patient. The patient was provided an opportunity to ask questions and all were answered. The patient agreed with the plan and demonstrated an understanding of the instructions.   The patient was advised to call back or seek an in-person evaluation if the symptoms worsen or if the condition fails to improve as anticipated.   Crystal Ruddy, MD

## 2019-01-09 ENCOUNTER — Other Ambulatory Visit: Payer: Self-pay | Admitting: Family Medicine

## 2019-01-14 ENCOUNTER — Ambulatory Visit (INDEPENDENT_AMBULATORY_CARE_PROVIDER_SITE_OTHER): Payer: 59 | Admitting: Licensed Clinical Social Worker

## 2019-01-14 DIAGNOSIS — F3341 Major depressive disorder, recurrent, in partial remission: Secondary | ICD-10-CM

## 2019-01-20 ENCOUNTER — Telehealth: Payer: Self-pay | Admitting: *Deleted

## 2019-01-20 NOTE — Telephone Encounter (Signed)
Copied from [place] [id number]. Topic: Referral - Status >> Jan 20, 2019 11:00 AM Scherrie Gerlach wrote: Reason for CRM: pt states Dr Maudie Mercury had referred her to a licensed psychiatrist  instead of the counselor, Ginny Forth she was seeing. Her employer, [employer] is requiring her to see a medical dr/ psychiatrist instatea and she needs an appt asap, this week.  Pt would like a call back asap

## 2019-01-20 NOTE — Telephone Encounter (Signed)
I placed a referral to psychiatry on 4/13. Please check on the status of this referral and update the patient. If she has any severe symptoms or urgent needs would recommend the behavioral health hospital.

## 2019-01-20 NOTE — Telephone Encounter (Signed)
I will forward this message to  Dr Maudie Mercury to advise on   [place] see message below

## 2019-01-20 NOTE — Telephone Encounter (Signed)
Dr. Maudie Mercury,  [place] referrals do not come into the regular referral work queue as those are not typically handled like regular referrals, therefore Neoma Laming would not know the referral had been placed unless notified by clinical staff or provider. Now that we are aware, she can call to follow up on referral and try to get pt in with an MD as requested.   Neoma Laming - please follow up on referral, pt indicates she needs an MD per employer.   Thanks! Esvin Hnat

## 2019-01-30 ENCOUNTER — Ambulatory Visit: Payer: Self-pay | Admitting: Licensed Clinical Social Worker

## 2019-02-27 IMAGING — MG DIGITAL DIAGNOSTIC BILATERAL MAMMOGRAM WITH TOMO AND CAD
8 of 15 series · 8 of 40 positions shown · non-contrast
Comparison: Previous exam(s).

CLINICAL DATA: Possible asymmetry in the posterior aspect of the
upper-outer right breast, possible mass in the upper central right
breast and possible mass in the posterior aspect of the upper-outer
left breast on a recent screening mammogram without comparisons.The
patient had a right breast ultrasound-guided core needle biopsy and
cyst aspiration in 1889 elsewhere. Those images are not available
for comparison. They also described a mammographically stable
asymmetry in the right breast at that time.

EXAM:
DIGITAL DIAGNOSTIC BILATERAL MAMMOGRAM
ULTRASOUND BILATERAL BREAST

[R CC synth-2D (1 of 3)]
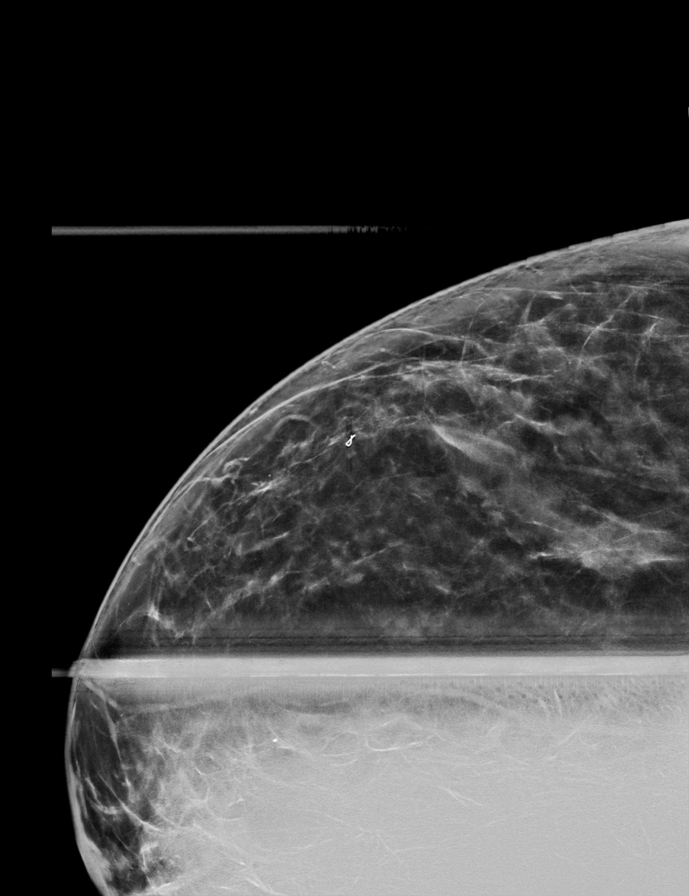

[R CC synth-2D (2 of 3)]
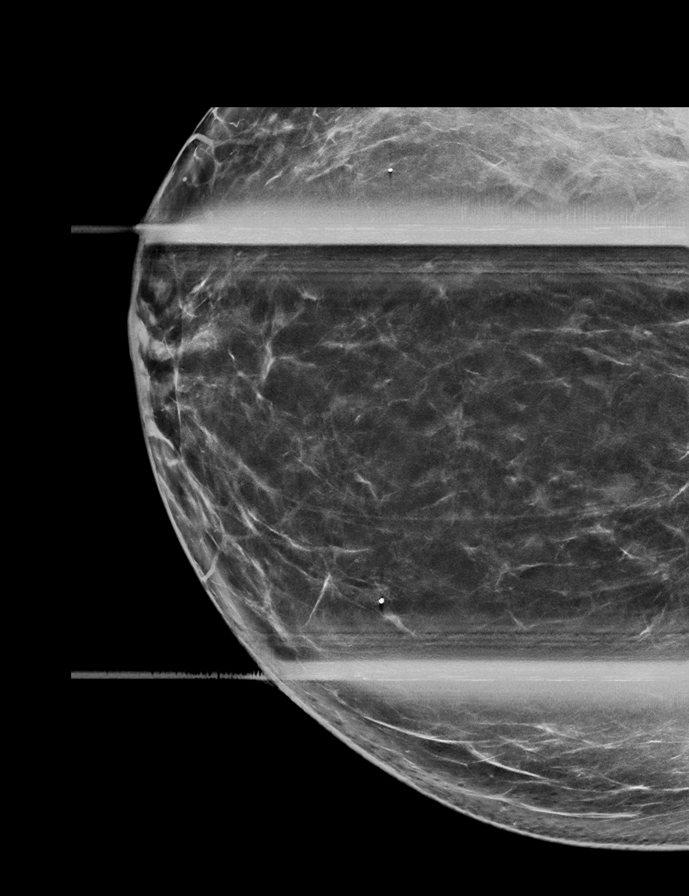

[L MLO synth-2D (1 of 2)]
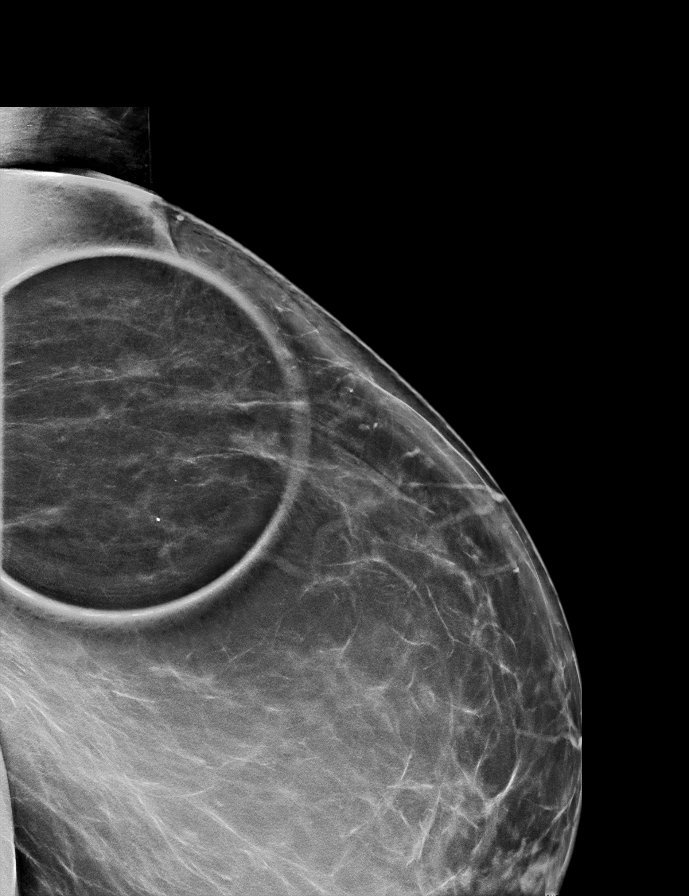

[R MLO synth-2D]
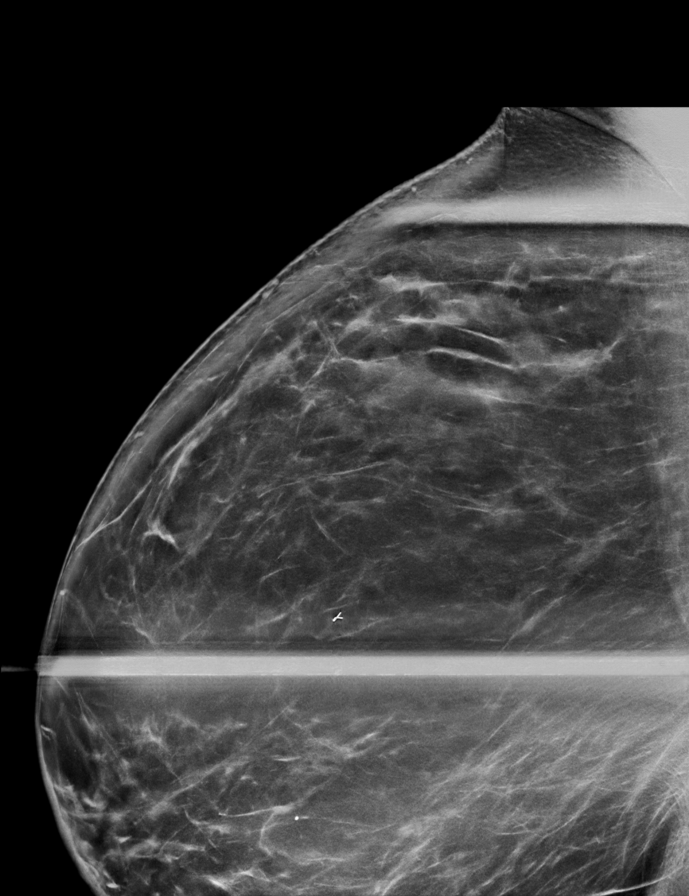

[L CC synth-2D]
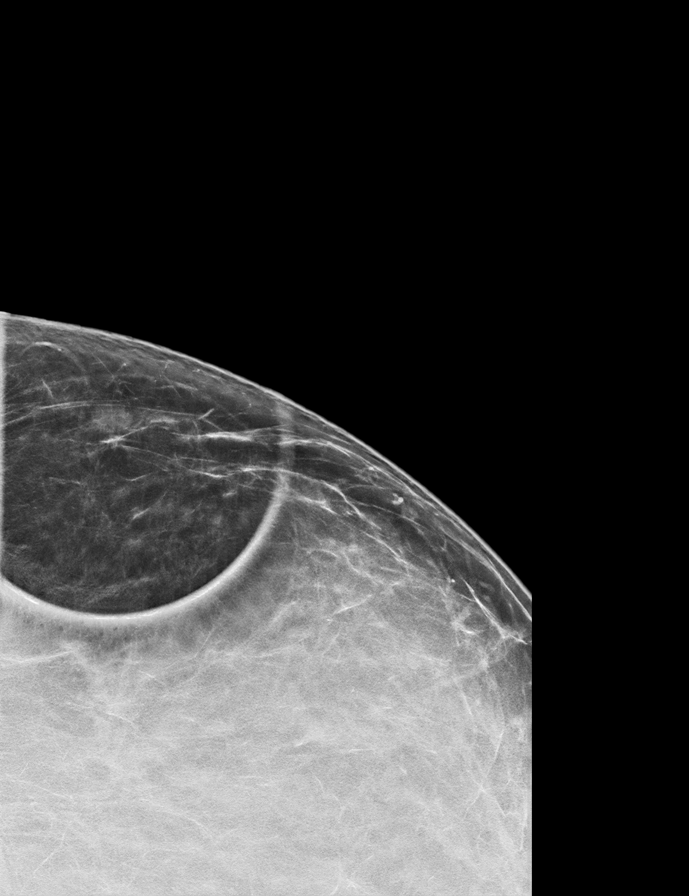

[L MLO synth-2D (2 of 2)]
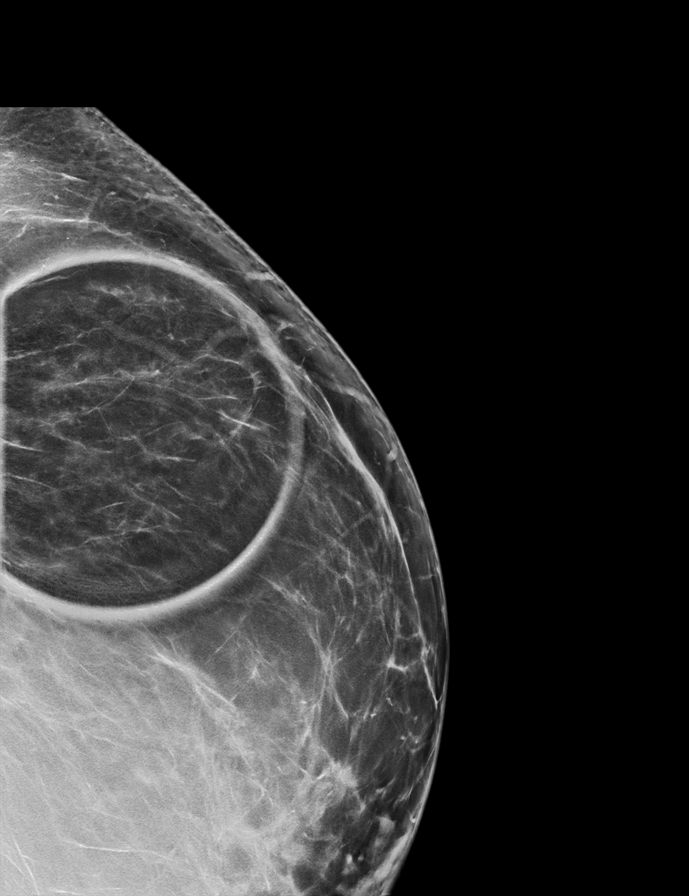

[R CC synth-2D (3 of 3)]
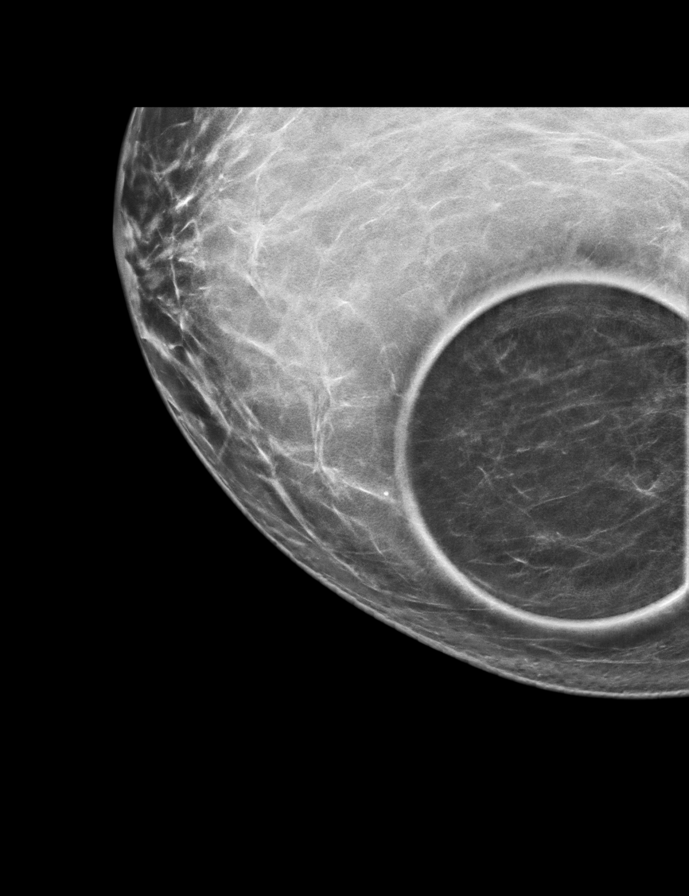

[R CC tomo · tomo slice 57/83.0]
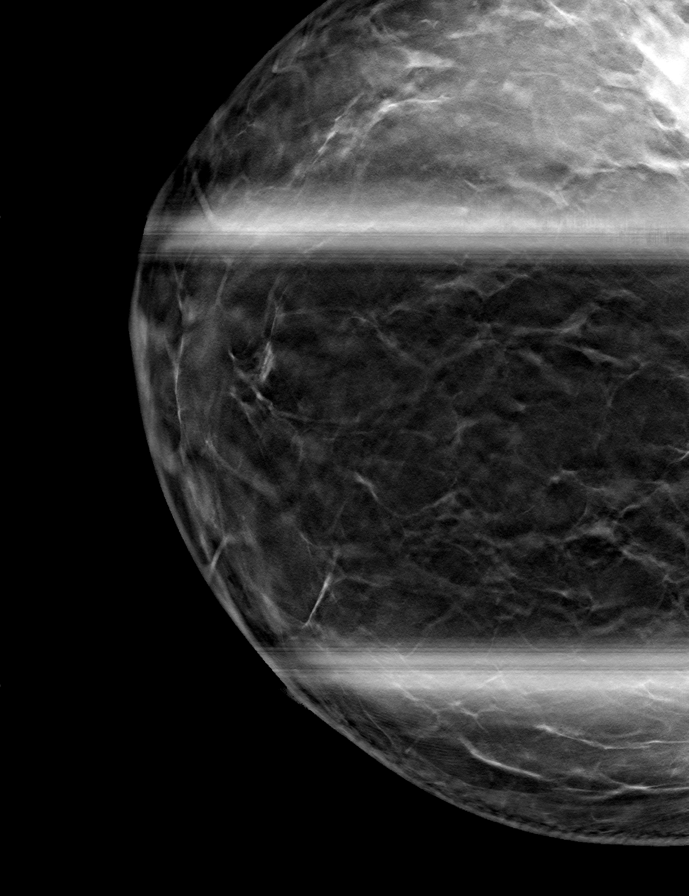

[8 of 40 positions shown; findings below may reference images not displayed]

ACR Breast Density Category b: There are scattered areas of
fibroglandular density.
FINDINGS: 3D tomographic and 2D generated spot compression views of both
breasts were obtained. These confirm a small, oval, circumscribed
mass in the posterior aspect of the right breast in the 12-1 o'clock
position of the breast.

Also demonstrated is a relatively large area of asymmetry with
interspersed fat in the posterior aspect of the upper-outer right
breast. There is a ribbon shaped biopsy marker clip anterior and
inferior to this area of asymmetry.

A small, oval, circumscribed mass is confirmed in the posterior
aspect of the outer left breast.

On physical exam, no mass is palpable in the outer left breast or
12-1 o'clock position of the right breast. There is an approximately
5 cm area of palpable soft tissue thickening and tenderness in the
upper outer right breast.

Targeted ultrasound is performed, showing a 9 x 8 x 5 mm oval,
horizontally oriented, circumscribed, hypoechoic mass containing a
thin internal septation in the 3 o'clock position of the left
breast, 10 cm from the nipple, corresponding to the mammographic
mass.

In the 12 to 1 o'clock position of the right breast, 3 cm from the
nipple, a 7 x 7 x 4 mm oval, horizontally oriented, circumscribed,
hypoechoic mass is demonstrated posteriorly. This has low-level
internal echoes and no internal blood flow with power Doppler. This
corresponds to the mass seen mammographically.

In the upper outer right breast, centered in the 10 o'clock
position, 10 cm from the nipple, there is an area of fibroglandular
tissue with interspersed fat and multiple small, rounded, hypoechoic
masses with low-level internal echoes, increased through
transmission of sound and no internal blood flow with power Doppler.
This corresponds to the area of mammographic asymmetry.
IMPRESSION: 1. 9 mm probably benign fibroadenoma in the 3 o'clock position of
the left breast.
2. 7 mm probable mildly complicated cyst in the 12 to 1 o'clock
position of the right breast.
3. Asymmetry in the upper outer right breast corresponding
sonographically to fibroglandular tissue and fibrocystic tissue with
multiple small probable mildly complicated cysts in the 10 o'clock
position of the right breast.

RECOMMENDATION:
Right diagnostic mammogram and bilateral breast ultrasound in 6
months.

I have discussed the findings and recommendations with the patient.
Results were also provided in writing at the conclusion of the
visit. If applicable, a reminder letter will be sent to the patient
regarding the next appointment.

BI-RADS CATEGORY  3: Probably benign.

## 2019-03-25 ENCOUNTER — Other Ambulatory Visit: Payer: Self-pay | Admitting: Family Medicine

## 2019-03-25 DIAGNOSIS — I10 Essential (primary) hypertension: Secondary | ICD-10-CM

## 2019-04-21 NOTE — Progress Notes (Signed)
Pt scheduled for 04/23/19

## 2019-04-23 ENCOUNTER — Other Ambulatory Visit: Payer: 59

## 2019-05-18 NOTE — Progress Notes (Signed)
Patient Care Team: [name], MD as Consulting Physician (Family Medicine)  DIAGNOSIS:    ICD-10-CM   1. Thrombocytosis ([place])  D47.3     CHIEF COMPLIANT: Follow-up of thrombocytosis   INTERVAL HISTORY: Crystal Nash is a 43 y.o. with above-mentioned history of thrombocytosis. Work-up on 10/30/18 showed Hg 10.8, HCT 34.6, platelets 393, iron saturation 12%, ferritin 37, CRP <0.8, JAK2 mutation negative. She presents to the clinic today for follow-up.   REVIEW OF SYSTEMS:   Constitutional: Denies fevers, chills or abnormal weight loss Eyes: Denies blurriness of vision Ears, nose, mouth, throat, and face: Denies mucositis or sore throat Respiratory: Denies cough, dyspnea or wheezes Cardiovascular: Denies palpitation, chest discomfort Gastrointestinal: Denies nausea, heartburn or change in bowel habits Skin: Denies abnormal skin rashes Lymphatics: Denies new lymphadenopathy or easy bruising Neurological: Denies numbness, tingling or new weaknesses Behavioral/Psych: Mood is stable, no new changes  Extremities: No lower extremity edema Breast: denies any pain or lumps or nodules in either breasts All other systems were reviewed with the patient and are negative.  I have reviewed the past medical history, past surgical history, social history and family history with the patient and they are unchanged from previous note.  ALLERGIES:  has No Known Allergies.  MEDICATIONS:  Current Outpatient Medications  Medication Sig Dispense Refill  . amLODipine (NORVASC) 10 MG tablet TAKE 1 TABLET BY MOUTH EVERY DAY 90 tablet 1  . Blood Pressure Monitor KIT Use as directed for daily monitoring 1 each 0  . Blood Pressure Monitoring (BLOOD PRESSURE KIT) DEVI 1 each by Other route as directed. To check blood pressure 1 Device 0  . cetirizine (ZYRTEC) 10 MG tablet Take 1 tablet (10 mg total) by mouth daily. 30 tablet 11  . DULoxetine (CYMBALTA) 20 MG capsule TAKE 1 CAPSULE BY MOUTH  EVERY DAY 90 capsule 1  . hydrochlorothiazide (HYDRODIURIL) 25 MG tablet TAKE 1 TABLET BY MOUTH EVERY DAY 90 tablet 1   No current facility-administered medications for this visit.     PHYSICAL EXAMINATION: ECOG PERFORMANCE STATUS: 0 - Asymptomatic  There were no vitals filed for this visit. There were no vitals filed for this visit.  GENERAL: alert, no distress and comfortable SKIN: skin color, texture, turgor are normal, no rashes or significant lesions EYES: normal, Conjunctiva are pink and non-injected, sclera clear OROPHARYNX: no exudate, no erythema and lips, buccal mucosa, and tongue normal  NECK: supple, thyroid normal size, non-tender, without nodularity LYMPH: no palpable lymphadenopathy in the cervical, axillary or inguinal LUNGS: clear to auscultation and percussion with normal breathing effort HEART: regular rate & rhythm and no murmurs and no lower extremity edema ABDOMEN: abdomen soft, non-tender and normal bowel sounds MUSCULOSKELETAL: no cyanosis of digits and no clubbing  NEURO: alert & oriented x 3 with fluent speech, no focal motor/sensory deficits EXTREMITIES: No lower extremity edema  LABORATORY DATA:  I have reviewed the data as listed CMP Latest Ref Rng & Units 10/21/2018 09/23/2018 08/02/2017  Glucose 70 - 99 mg/dL 105(H) 144(H) 94  BUN 6 - 23 mg/dL '16 16 13  ' Creatinine 0.40 - 1.20 mg/dL 1.15 0.94 0.95  Sodium 135 - 145 mEq/L 136 138 138  Potassium 3.5 - 5.1 mEq/L 3.5 3.5 3.5  Chloride 96 - 112 mEq/L 96 98 100  CO2 19 - 32 mEq/L '29 31 29  ' Calcium 8.4 - 10.5 mg/dL 9.9 9.8 9.9  Total Protein 6.0 - 8.3 g/dL - - 8.5(H)  Total Bilirubin 0.2 - 1.2  mg/dL - - 0.4  Alkaline Phos 39 - 117 U/L - - 105  AST 0 - 37 U/L - - 24  ALT 0 - 35 U/L - - 28    Lab Results  Component Value Date   WBC 8.0 10/30/2018   HGB 10.8 (L) 10/30/2018   HCT 34.6 (L) 10/30/2018   MCV 83.8 10/30/2018   PLT 393 10/30/2018   NEUTROABS 5.1 10/30/2018    ASSESSMENT & PLAN:   Thrombocytosis ([facility]) Platelet count mildly increased. Lab review:  08/03/2017: Platelets 443 09/23/2018: Platelets 461, RDW 17.4, hemoglobin 12.2, MCV 80.3 10/30/2018: Platelet count 393, CRP less than 0.8, iron studies: Iron saturation 12%, ferritin 37, MPN panel: No mutations identified  Cause of thrombocytosis: Reactive to mild iron deficiency Lab review:  We will request labs to be drawn tomorrow and I will call her with the results of this test. Patient tells me that her mother passed away in 02/17/2023 for metastatic pancreatic cancer.  If the labs look normal then we will see her on an as-needed basis.  No orders of the defined types were placed in this encounter.  The patient has a good understanding of the overall plan. she agrees with it. she will call with any problems that may develop before the next visit here.  Nicholas Lose, MD 05/19/2019  Julious Oka Dorshimer am acting as scribe for Dr. Nicholas Lose.  I have reviewed the above documentation for accuracy and completeness, and I agree with the above.

## 2019-05-19 ENCOUNTER — Inpatient Hospital Stay: Payer: 59 | Attending: Hematology and Oncology | Admitting: Hematology and Oncology

## 2019-05-19 DIAGNOSIS — D473 Essential (hemorrhagic) thrombocythemia: Secondary | ICD-10-CM

## 2019-05-19 DIAGNOSIS — R7989 Other specified abnormal findings of blood chemistry: Secondary | ICD-10-CM | POA: Insufficient documentation

## 2019-05-19 DIAGNOSIS — Z79899 Other long term (current) drug therapy: Secondary | ICD-10-CM | POA: Insufficient documentation

## 2019-05-19 DIAGNOSIS — D75839 Thrombocytosis, unspecified: Secondary | ICD-10-CM

## 2019-05-19 NOTE — Assessment & Plan Note (Signed)
Platelet count mildly increased. Lab review:  08/03/2017: Platelets 443 09/23/2018: Platelets 461, RDW 17.4, hemoglobin 12.2, MCV 80.3 10/30/2018: Platelet count 393, CRP less than 0.8, iron studies: Iron saturation 12%, ferritin 37, MPN panel: No mutations identified  Cause of thrombocytosis: Reactive to mild iron deficiency Lab review: 05/19/2019:

## 2019-05-20 ENCOUNTER — Inpatient Hospital Stay: Payer: 59

## 2019-05-20 ENCOUNTER — Other Ambulatory Visit: Payer: Self-pay | Admitting: *Deleted

## 2019-05-20 ENCOUNTER — Other Ambulatory Visit: Payer: Self-pay

## 2019-05-20 DIAGNOSIS — R7989 Other specified abnormal findings of blood chemistry: Secondary | ICD-10-CM | POA: Diagnosis present

## 2019-05-20 DIAGNOSIS — D473 Essential (hemorrhagic) thrombocythemia: Secondary | ICD-10-CM

## 2019-05-20 DIAGNOSIS — D75839 Thrombocytosis, unspecified: Secondary | ICD-10-CM

## 2019-05-20 DIAGNOSIS — Z79899 Other long term (current) drug therapy: Secondary | ICD-10-CM | POA: Diagnosis not present

## 2019-05-20 LAB — CBC WITH DIFFERENTIAL (CANCER CENTER ONLY)
Abs Immature Granulocytes: 0.03 10*3/uL (ref 0.00–0.07)
Basophils Absolute: 0.1 10*3/uL (ref 0.0–0.1)
Basophils Relative: 1 %
Eosinophils Absolute: 0.4 10*3/uL (ref 0.0–0.5)
Eosinophils Relative: 4 %
HCT: 36.6 % (ref 36.0–46.0)
Hemoglobin: 11.8 g/dL — ABNORMAL LOW (ref 12.0–15.0)
Immature Granulocytes: 0 %
Lymphocytes Relative: 22 %
Lymphs Abs: 2 10*3/uL (ref 0.7–4.0)
MCH: 26.8 pg (ref 26.0–34.0)
MCHC: 32.2 g/dL (ref 30.0–36.0)
MCV: 83 fL (ref 80.0–100.0)
Monocytes Absolute: 0.5 10*3/uL (ref 0.1–1.0)
Monocytes Relative: 5 %
Neutro Abs: 6.2 10*3/uL (ref 1.7–7.7)
Neutrophils Relative %: 68 %
Platelet Count: 425 10*3/uL — ABNORMAL HIGH (ref 150–400)
RBC: 4.41 MIL/uL (ref 3.87–5.11)
RDW: 15.9 % — ABNORMAL HIGH (ref 11.5–15.5)
WBC Count: 9.1 10*3/uL (ref 4.0–10.5)
nRBC: 0 % (ref 0.0–0.2)

## 2019-06-05 ENCOUNTER — Ambulatory Visit (INDEPENDENT_AMBULATORY_CARE_PROVIDER_SITE_OTHER): Payer: 59 | Admitting: Licensed Clinical Social Worker

## 2019-06-05 DIAGNOSIS — F331 Major depressive disorder, recurrent, moderate: Secondary | ICD-10-CM

## 2019-06-18 ENCOUNTER — Ambulatory Visit (INDEPENDENT_AMBULATORY_CARE_PROVIDER_SITE_OTHER): Payer: 59 | Admitting: Licensed Clinical Social Worker

## 2019-06-18 DIAGNOSIS — F3341 Major depressive disorder, recurrent, in partial remission: Secondary | ICD-10-CM | POA: Diagnosis not present

## 2019-07-10 ENCOUNTER — Ambulatory Visit (INDEPENDENT_AMBULATORY_CARE_PROVIDER_SITE_OTHER): Payer: 59 | Admitting: Licensed Clinical Social Worker

## 2019-07-10 DIAGNOSIS — F3341 Major depressive disorder, recurrent, in partial remission: Secondary | ICD-10-CM

## 2019-07-15 ENCOUNTER — Ambulatory Visit (INDEPENDENT_AMBULATORY_CARE_PROVIDER_SITE_OTHER): Payer: 59 | Admitting: Licensed Clinical Social Worker

## 2019-07-15 DIAGNOSIS — F3341 Major depressive disorder, recurrent, in partial remission: Secondary | ICD-10-CM | POA: Diagnosis not present

## 2019-07-28 ENCOUNTER — Ambulatory Visit (INDEPENDENT_AMBULATORY_CARE_PROVIDER_SITE_OTHER): Payer: 59 | Admitting: Licensed Clinical Social Worker

## 2019-07-28 DIAGNOSIS — F3341 Major depressive disorder, recurrent, in partial remission: Secondary | ICD-10-CM

## 2019-08-14 ENCOUNTER — Ambulatory Visit (INDEPENDENT_AMBULATORY_CARE_PROVIDER_SITE_OTHER): Payer: 59 | Admitting: Licensed Clinical Social Worker

## 2019-08-14 DIAGNOSIS — F3341 Major depressive disorder, recurrent, in partial remission: Secondary | ICD-10-CM

## 2019-09-11 ENCOUNTER — Ambulatory Visit: Payer: Self-pay | Admitting: Licensed Clinical Social Worker
